# Patient Record
Sex: Female | Born: 1950 | Race: Black or African American | Hispanic: No | Marital: Married | Smoking: Never smoker
Health system: Southern US, Community
[De-identification: ages and names within clinical notes are randomized; demographics above are authoritative.]

## PROBLEM LIST (undated history)

## (undated) DIAGNOSIS — Z923 Personal history of irradiation: Secondary | ICD-10-CM

## (undated) DIAGNOSIS — F329 Major depressive disorder, single episode, unspecified: Secondary | ICD-10-CM

## (undated) DIAGNOSIS — M810 Age-related osteoporosis without current pathological fracture: Secondary | ICD-10-CM

## (undated) DIAGNOSIS — M25519 Pain in unspecified shoulder: Secondary | ICD-10-CM

## (undated) DIAGNOSIS — F411 Generalized anxiety disorder: Secondary | ICD-10-CM

## (undated) DIAGNOSIS — G4700 Insomnia, unspecified: Secondary | ICD-10-CM

## (undated) DIAGNOSIS — I10 Essential (primary) hypertension: Secondary | ICD-10-CM

## (undated) DIAGNOSIS — R002 Palpitations: Secondary | ICD-10-CM

## (undated) DIAGNOSIS — C310 Malignant neoplasm of maxillary sinus: Secondary | ICD-10-CM

## (undated) DIAGNOSIS — C801 Malignant (primary) neoplasm, unspecified: Secondary | ICD-10-CM

## (undated) HISTORY — DX: Malignant (primary) neoplasm, unspecified: C80.1

## (undated) HISTORY — DX: Insomnia, unspecified: G47.00

## (undated) HISTORY — DX: Generalized anxiety disorder: F41.1

## (undated) HISTORY — DX: Pain in unspecified shoulder: M25.519

## (undated) HISTORY — DX: Essential (primary) hypertension: I10

## (undated) HISTORY — DX: Age-related osteoporosis without current pathological fracture: M81.0

## (undated) HISTORY — DX: Palpitations: R00.2

## (undated) HISTORY — DX: Major depressive disorder, single episode, unspecified: F32.9

## (undated) HISTORY — DX: Malignant neoplasm of maxillary sinus: C31.0

## (undated) HISTORY — DX: Personal history of irradiation: Z92.3

---

## 1999-06-03 ENCOUNTER — Other Ambulatory Visit: Admission: RE | Admit: 1999-06-03 | Discharge: 1999-06-03 | Payer: Self-pay | Admitting: Obstetrics and Gynecology

## 2002-03-15 ENCOUNTER — Other Ambulatory Visit: Admission: RE | Admit: 2002-03-15 | Discharge: 2002-03-15 | Payer: Self-pay | Admitting: Obstetrics and Gynecology

## 2003-03-21 ENCOUNTER — Other Ambulatory Visit: Admission: RE | Admit: 2003-03-21 | Discharge: 2003-03-21 | Payer: Self-pay | Admitting: Obstetrics and Gynecology

## 2003-07-20 ENCOUNTER — Emergency Department (HOSPITAL_COMMUNITY): Admission: EM | Admit: 2003-07-20 | Discharge: 2003-07-20 | Payer: Self-pay | Admitting: Emergency Medicine

## 2003-12-22 ENCOUNTER — Emergency Department (HOSPITAL_COMMUNITY): Admission: EM | Admit: 2003-12-22 | Discharge: 2003-12-22 | Payer: Self-pay | Admitting: Emergency Medicine

## 2004-07-02 ENCOUNTER — Ambulatory Visit: Payer: Self-pay | Admitting: Psychiatry

## 2004-07-11 ENCOUNTER — Encounter: Payer: Self-pay | Admitting: Internal Medicine

## 2004-09-23 ENCOUNTER — Ambulatory Visit: Payer: Self-pay | Admitting: Internal Medicine

## 2004-10-21 ENCOUNTER — Other Ambulatory Visit: Admission: RE | Admit: 2004-10-21 | Discharge: 2004-10-21 | Payer: Self-pay | Admitting: Obstetrics and Gynecology

## 2005-03-16 ENCOUNTER — Ambulatory Visit: Payer: Self-pay | Admitting: Internal Medicine

## 2005-03-23 ENCOUNTER — Ambulatory Visit: Payer: Self-pay | Admitting: Internal Medicine

## 2005-12-01 ENCOUNTER — Ambulatory Visit: Payer: Self-pay | Admitting: Internal Medicine

## 2006-04-13 ENCOUNTER — Ambulatory Visit: Payer: Self-pay | Admitting: Internal Medicine

## 2006-04-14 LAB — CONVERTED CEMR LAB
ALT: 19 units/L (ref 0–40)
Albumin: 3.6 g/dL (ref 3.5–5.2)
Chloride: 106 meq/L (ref 96–112)
Glucose, Bld: 110 mg/dL — ABNORMAL HIGH (ref 70–99)
MCV: 72.4 fL — ABNORMAL LOW (ref 78.0–100.0)
Potassium: 4.7 meq/L (ref 3.5–5.1)
RBC: 5.43 M/uL — ABNORMAL HIGH (ref 3.87–5.11)
RDW: 14.2 % (ref 11.5–14.6)
Sodium: 140 meq/L (ref 135–145)
TSH: 1.29 microintl units/mL (ref 0.35–5.50)
Total Bilirubin: 0.4 mg/dL (ref 0.3–1.2)
WBC: 5.1 10*3/uL (ref 4.5–10.5)

## 2007-06-16 ENCOUNTER — Telehealth: Payer: Self-pay | Admitting: Internal Medicine

## 2007-06-16 ENCOUNTER — Ambulatory Visit: Payer: Self-pay | Admitting: Internal Medicine

## 2007-06-16 DIAGNOSIS — F4322 Adjustment disorder with anxiety: Secondary | ICD-10-CM | POA: Insufficient documentation

## 2007-06-16 DIAGNOSIS — F411 Generalized anxiety disorder: Secondary | ICD-10-CM

## 2007-06-16 DIAGNOSIS — G4700 Insomnia, unspecified: Secondary | ICD-10-CM

## 2007-06-16 HISTORY — DX: Insomnia, unspecified: G47.00

## 2007-06-16 HISTORY — DX: Generalized anxiety disorder: F41.1

## 2007-06-17 ENCOUNTER — Telehealth: Payer: Self-pay | Admitting: Internal Medicine

## 2007-11-03 ENCOUNTER — Ambulatory Visit: Payer: Self-pay | Admitting: Internal Medicine

## 2007-11-03 LAB — CONVERTED CEMR LAB
ALT: 19 U/L
AST: 17 U/L
Albumin: 3.7 g/dL
Alkaline Phosphatase: 58 U/L
BUN: 12 mg/dL
Basophils Absolute: 0 K/uL
Basophils Relative: 0.7 %
Bilirubin, Direct: 0.1 mg/dL
CO2: 31 meq/L
Calcium: 9.3 mg/dL
Chloride: 107 meq/L
Cholesterol: 243 mg/dL
Creatinine, Ser: 0.9 mg/dL
Direct LDL: 139.5 mg/dL
Eosinophils Absolute: 0.1 K/uL
Eosinophils Relative: 2.6 %
GFR calc Af Amer: 83 mL/min
GFR calc non Af Amer: 69 mL/min
Glucose, Bld: 75 mg/dL
Glucose, Urine, Semiquant: NEGATIVE
HCT: 39.1 %
HDL: 87.3 mg/dL
Hemoglobin: 12.8 g/dL
Ketones, urine, test strip: NEGATIVE
Lymphocytes Relative: 23.5 %
MCHC: 32.7 g/dL
MCV: 71.7 fL — ABNORMAL LOW
Monocytes Absolute: 0.4 K/uL
Monocytes Relative: 9.6 %
Neutro Abs: 2.5 K/uL
Neutrophils Relative %: 63.6 %
Nitrite: NEGATIVE
Platelets: 199 K/uL
Potassium: 4.3 meq/L
Protein, U semiquant: NEGATIVE
RBC: 5.45 M/uL — ABNORMAL HIGH
RDW: 14.1 %
Sodium: 143 meq/L
Specific Gravity, Urine: 1.02
TSH: 1.49 u[IU]/mL
Total Bilirubin: 0.7 mg/dL
Total CHOL/HDL Ratio: 2.8
Total Protein: 6.8 g/dL
Triglycerides: 44 mg/dL
VLDL: 9 mg/dL
WBC: 3.9 10*3/microliter — ABNORMAL LOW
pH: 6.5

## 2007-11-04 ENCOUNTER — Ambulatory Visit: Payer: Self-pay | Admitting: Internal Medicine

## 2007-11-04 DIAGNOSIS — F3289 Other specified depressive episodes: Secondary | ICD-10-CM

## 2007-11-04 DIAGNOSIS — F329 Major depressive disorder, single episode, unspecified: Secondary | ICD-10-CM

## 2007-11-04 DIAGNOSIS — M810 Age-related osteoporosis without current pathological fracture: Secondary | ICD-10-CM

## 2007-11-04 HISTORY — DX: Major depressive disorder, single episode, unspecified: F32.9

## 2007-11-04 HISTORY — DX: Age-related osteoporosis without current pathological fracture: M81.0

## 2007-11-04 HISTORY — DX: Other specified depressive episodes: F32.89

## 2008-05-29 ENCOUNTER — Ambulatory Visit: Payer: Self-pay | Admitting: Internal Medicine

## 2008-05-29 DIAGNOSIS — M25519 Pain in unspecified shoulder: Secondary | ICD-10-CM

## 2008-05-29 HISTORY — DX: Pain in unspecified shoulder: M25.519

## 2008-06-29 HISTORY — PX: SKIN GRAFT: SHX250

## 2008-07-10 ENCOUNTER — Encounter: Payer: Self-pay | Admitting: Internal Medicine

## 2008-07-11 ENCOUNTER — Ambulatory Visit: Payer: Self-pay | Admitting: Internal Medicine

## 2008-07-11 DIAGNOSIS — R002 Palpitations: Secondary | ICD-10-CM

## 2008-07-11 HISTORY — DX: Palpitations: R00.2

## 2008-10-23 ENCOUNTER — Ambulatory Visit: Payer: Self-pay | Admitting: Internal Medicine

## 2008-10-29 ENCOUNTER — Telehealth: Payer: Self-pay | Admitting: Internal Medicine

## 2008-12-11 ENCOUNTER — Ambulatory Visit: Payer: Self-pay | Admitting: Internal Medicine

## 2008-12-11 DIAGNOSIS — I10 Essential (primary) hypertension: Secondary | ICD-10-CM

## 2008-12-11 HISTORY — DX: Essential (primary) hypertension: I10

## 2009-01-17 ENCOUNTER — Telehealth: Payer: Self-pay | Admitting: Internal Medicine

## 2009-02-05 ENCOUNTER — Encounter: Admission: RE | Admit: 2009-02-05 | Discharge: 2009-02-05 | Payer: Self-pay | Admitting: Otolaryngology

## 2009-02-27 HISTORY — PX: OTHER SURGICAL HISTORY: SHX169

## 2009-03-24 ENCOUNTER — Encounter: Payer: Self-pay | Admitting: Internal Medicine

## 2009-04-04 ENCOUNTER — Encounter (INDEPENDENT_AMBULATORY_CARE_PROVIDER_SITE_OTHER): Payer: Self-pay | Admitting: *Deleted

## 2009-04-18 ENCOUNTER — Encounter (INDEPENDENT_AMBULATORY_CARE_PROVIDER_SITE_OTHER): Payer: Self-pay | Admitting: *Deleted

## 2009-04-18 ENCOUNTER — Ambulatory Visit: Admission: RE | Admit: 2009-04-18 | Discharge: 2009-06-28 | Payer: Self-pay | Admitting: Radiation Oncology

## 2009-04-19 ENCOUNTER — Ambulatory Visit: Payer: Self-pay | Admitting: Internal Medicine

## 2009-04-19 ENCOUNTER — Encounter (INDEPENDENT_AMBULATORY_CARE_PROVIDER_SITE_OTHER): Payer: Self-pay | Admitting: *Deleted

## 2009-04-19 DIAGNOSIS — C310 Malignant neoplasm of maxillary sinus: Secondary | ICD-10-CM | POA: Insufficient documentation

## 2009-04-19 HISTORY — DX: Malignant neoplasm of maxillary sinus: C31.0

## 2009-05-10 ENCOUNTER — Encounter (INDEPENDENT_AMBULATORY_CARE_PROVIDER_SITE_OTHER): Payer: Self-pay | Admitting: *Deleted

## 2009-05-10 LAB — BUN: BUN: 17 mg/dL (ref 6–23)

## 2009-05-10 LAB — CREATININE, SERUM: Creatinine, Ser: 0.7 mg/dL (ref 0.40–1.20)

## 2009-05-16 ENCOUNTER — Encounter (INDEPENDENT_AMBULATORY_CARE_PROVIDER_SITE_OTHER): Payer: Self-pay | Admitting: *Deleted

## 2009-06-30 ENCOUNTER — Ambulatory Visit: Admission: RE | Admit: 2009-06-30 | Discharge: 2009-07-19 | Payer: Self-pay | Admitting: Radiation Oncology

## 2009-07-19 ENCOUNTER — Encounter: Payer: Self-pay | Admitting: Internal Medicine

## 2009-07-25 ENCOUNTER — Ambulatory Visit: Payer: Self-pay | Admitting: Internal Medicine

## 2009-08-15 ENCOUNTER — Encounter: Payer: Self-pay | Admitting: Internal Medicine

## 2009-10-17 ENCOUNTER — Encounter: Payer: Self-pay | Admitting: Internal Medicine

## 2009-12-26 ENCOUNTER — Encounter: Payer: Self-pay | Admitting: Internal Medicine

## 2010-04-11 ENCOUNTER — Encounter: Admission: RE | Admit: 2010-04-11 | Discharge: 2010-04-11 | Payer: Self-pay | Admitting: Otolaryngology

## 2010-05-01 ENCOUNTER — Encounter: Payer: Self-pay | Admitting: Internal Medicine

## 2010-05-26 ENCOUNTER — Encounter: Payer: Self-pay | Admitting: Internal Medicine

## 2010-06-09 ENCOUNTER — Encounter: Payer: Self-pay | Admitting: Internal Medicine

## 2010-06-18 ENCOUNTER — Encounter
Admission: RE | Admit: 2010-06-18 | Discharge: 2010-06-18 | Payer: Self-pay | Source: Home / Self Care | Attending: Otolaryngology | Admitting: Otolaryngology

## 2010-06-29 LAB — HM MAMMOGRAPHY: HM Mammogram: NEGATIVE

## 2010-06-29 LAB — HM PAP SMEAR: HM Pap smear: NORMAL

## 2010-07-29 NOTE — Letter (Signed)
Summary: [facility]  [facility]   Imported By: Maryln Gottron 09/10/2009 14:01:26  _____________________________________________________________________  External Attachment:    Type:   Image     Comment:   External Document

## 2010-07-29 NOTE — Letter (Signed)
[facility]   Imported By: Maryln Gottron 01/23/2010 12:43:09  _____________________________________________________________________  External Attachment:    Type:   Image     Comment:   External Document

## 2010-07-29 NOTE — Letter (Signed)
[facility]   Imported By: Maryln Gottron 01/23/2010 12:17:00  _____________________________________________________________________  External Attachment:    Type:   Image     Comment:   External Document

## 2010-07-29 NOTE — Letter (Signed)
Summary: [facility]  [facility]   Imported By: Maryln Gottron 11/05/2009 12:32:31  _____________________________________________________________________  External Attachment:    Type:   Image     Comment:   External Document

## 2010-07-29 NOTE — Letter (Signed)
Summary:  [facility]  [facility]   Imported By: Maryln Gottron 05/20/2010 12:28:16  _____________________________________________________________________  External Attachment:    Type:   Image     Comment:   External Document

## 2010-07-29 NOTE — Assessment & Plan Note (Signed)
Summary: F/U ON HTN CONCERNS // RS   Vital Signs:  Patient profile:   60 year old female Weight:      112 pounds BP sitting:   102 / 76  (left arm) Cuff size:   regular  Vitals Entered By: Raechel Ache, RN (July 25, 2009 11:47 AM) CC: Check BP.   CC:  Check BP.Marland Kitchen  History of Present Illness: 60 year old patient who is seen today for follow-up of her hypertension.  No concerns or complaints.  She has been followed closely by ENT at [facility].  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  She does monitor her blood pressure readings at home with much results.  He denies any symptoms concerning for orthostatic hypotension.  Her weight has been stable  Past History:  Past Medical History: Reviewed history from 04/19/2009 and no changes required. Anxiety Depression Osteoporosis G3 P3, A0 remote history of paresthesias, and diplopia Hypertension- treated June 2010 history of right maxillary cancer-TINOMO  Review of Systems  The patient denies anorexia, fever, weight loss, weight gain, vision loss, decreased hearing, hoarseness, chest pain, syncope, dyspnea on exertion, peripheral edema, prolonged cough, headaches, hemoptysis, abdominal pain, melena, hematochezia, severe indigestion/heartburn, hematuria, incontinence, genital sores, muscle weakness, suspicious skin lesions, transient blindness, difficulty walking, depression, unusual weight change, abnormal bleeding, enlarged lymph nodes, angioedema, and breast masses.    Physical Exam  General:  Well-developed,well-nourished,in no acute distress; alert,appropriate and cooperative throughout examination; blood pressure 100/64 Head:  Normocephalic and atraumatic without obvious abnormalities. No apparent alopecia or balding. Eyes:  No corneal or conjunctival inflammation noted. EOMI. Perrla. Funduscopic exam benign, without hemorrhages, exudates or papilledema. Vision grossly  normal. Mouth:  Oral mucosa and oropharynx without lesions or exudates.  Teeth in good repair. hoarse;  large defect in the right buccal mucosa Neck:  No deformities, masses, or tenderness noted. Lungs:  Normal respiratory effort, chest expands symmetrically. Lungs are clear to auscultation, no crackles or wheezes. Heart:  Normal rate and regular rhythm. S1 and S2 normal without gallop, murmur, click, rub or other extra sounds. Abdomen:  Bowel sounds positive,abdomen soft and non-tender without masses, organomegaly or hernias noted.   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 One daily  Her updated medication list for this problem includes:    Hydrochlorothiazide 25 Mg Tabs (Hydrochlorothiazide) ..... One daily  Complete Medication List: 1)  Fosamax 70 Mg Tabs (Alendronate sodium) .Marland Kitchen.. 1 q week 2)  Lorazepam 0.5 Mg Tabs (Lorazepam) .... One twice daily as needed for anxiety 3)  Hydrochlorothiazide 25 Mg Tabs (Hydrochlorothiazide) .... One daily  Patient Instructions: 1)  Limit your Sodium (Salt). 2)  It is important that you exercise regularly at least 20 minutes 5 times a week. If you develop chest pain, have severe difficulty breathing, or feel very tired , stop exercising immediately and seek medical attention. 3)  Check your Blood Pressure regularly. If it is above 150/90  you should make an appointment. Prescriptions: HYDROCHLOROTHIAZIDE 25 MG TABS (HYDROCHLOROTHIAZIDE) one daily  #90 x 6   Entered and Authorized by:   Gordy Savers  MD   Signed by:   Gordy Savers  MD on 07/25/2009   Method used:   Print then Give to Patient   RxID:   [id number]  LORAZEPAM 0.5 MG TABS (LORAZEPAM) one twice daily as needed for anxiety  #90 x 4   Entered and Authorized  by:   Gordy Savers  MD   Signed by:   Gordy Savers  MD on 07/25/2009   Method used:   Print then Give to Patient   RxID:   [id number] FOSAMAX 70 MG  TABS (ALENDRONATE SODIUM) 1 q week  #12 x 6   Entered and Authorized by:   Gordy Savers  MD   Signed by:   Gordy Savers  MD on 07/25/2009   Method used:   Print then Give to Patient   RxID:   [id number]

## 2010-07-31 NOTE — Letter (Signed)
Summary: [facility]  [facility]   Imported By: Maryln Gottron 06/11/2010 13:21:14  _____________________________________________________________________  External Attachment:    Type:   Image     Comment:   External Document

## 2010-07-31 NOTE — Letter (Signed)
Summary: [facility]  [facility]   Imported By: Maryln Gottron 07/01/2010 11:02:06  _____________________________________________________________________  External Attachment:    Type:   Image     Comment:   External Document

## 2010-08-29 ENCOUNTER — Other Ambulatory Visit: Payer: Self-pay | Admitting: Otolaryngology

## 2010-08-29 DIAGNOSIS — E041 Nontoxic single thyroid nodule: Secondary | ICD-10-CM

## 2010-09-02 ENCOUNTER — Other Ambulatory Visit: Payer: Self-pay

## 2010-10-08 ENCOUNTER — Telehealth: Payer: Self-pay | Admitting: Emergency Medicine

## 2010-10-08 ENCOUNTER — Inpatient Hospital Stay: Admission: RE | Admit: 2010-10-08 | Payer: Self-pay | Source: Ambulatory Visit

## 2010-10-16 ENCOUNTER — Other Ambulatory Visit (INDEPENDENT_AMBULATORY_CARE_PROVIDER_SITE_OTHER): Payer: Managed Care, Other (non HMO)

## 2010-10-16 DIAGNOSIS — E785 Hyperlipidemia, unspecified: Secondary | ICD-10-CM

## 2010-10-16 DIAGNOSIS — Z0000 Encounter for general adult medical examination without abnormal findings: Secondary | ICD-10-CM

## 2010-10-16 LAB — CBC WITH DIFFERENTIAL/PLATELET
Basophils Relative: 0.5 % (ref 0.0–3.0)
Eosinophils Relative: 3 % (ref 0.0–5.0)
HCT: 40.7 % (ref 36.0–46.0)
Lymphs Abs: 0.7 10*3/uL (ref 0.7–4.0)
MCV: 73 fl — ABNORMAL LOW (ref 78.0–100.0)
Monocytes Absolute: 0.5 10*3/uL (ref 0.1–1.0)
Platelets: 221 10*3/uL (ref 150.0–400.0)
WBC: 5.8 10*3/uL (ref 4.5–10.5)

## 2010-10-16 LAB — POCT URINALYSIS DIPSTICK
Bilirubin, UA: NEGATIVE
Ketones, UA: NEGATIVE
Spec Grav, UA: 1.02
pH, UA: 5

## 2010-10-16 LAB — HEPATIC FUNCTION PANEL
ALT: 18 U/L (ref 0–35)
Albumin: 3.7 g/dL (ref 3.5–5.2)
Total Bilirubin: 0.4 mg/dL (ref 0.3–1.2)
Total Protein: 6.8 g/dL (ref 6.0–8.3)

## 2010-10-16 LAB — LIPID PANEL
Cholesterol: 219 mg/dL — ABNORMAL HIGH (ref 0–200)
Triglycerides: 31 mg/dL (ref 0.0–149.0)

## 2010-10-16 LAB — BASIC METABOLIC PANEL
BUN: 16 mg/dL (ref 6–23)
Chloride: 105 mEq/L (ref 96–112)
Potassium: 4.5 mEq/L (ref 3.5–5.1)

## 2010-10-22 ENCOUNTER — Encounter: Payer: Self-pay | Admitting: Internal Medicine

## 2010-10-23 ENCOUNTER — Encounter: Payer: Self-pay | Admitting: Internal Medicine

## 2010-10-23 ENCOUNTER — Ambulatory Visit (INDEPENDENT_AMBULATORY_CARE_PROVIDER_SITE_OTHER): Payer: Managed Care, Other (non HMO) | Admitting: Internal Medicine

## 2010-10-23 VITALS — BP 116/78 | HR 76 | Temp 98.4°F | Resp 18 | Ht 63.5 in | Wt 119.0 lb

## 2010-10-23 DIAGNOSIS — C310 Malignant neoplasm of maxillary sinus: Secondary | ICD-10-CM

## 2010-10-23 DIAGNOSIS — I10 Essential (primary) hypertension: Secondary | ICD-10-CM

## 2010-10-23 DIAGNOSIS — Z0000 Encounter for general adult medical examination without abnormal findings: Secondary | ICD-10-CM

## 2010-10-23 MED ORDER — LORAZEPAM 0.5 MG PO TABS: 0.5000 mg | ORAL_TABLET | Freq: Two times a day (BID) | ORAL | Status: DC | PRN

## 2010-10-23 MED ORDER — HYDROCHLOROTHIAZIDE 25 MG PO TABS: 25.0000 mg | ORAL_TABLET | Freq: Every day | ORAL | Status: DC

## 2010-10-23 NOTE — Patient Instructions (Signed)
Dermatology evaluation as discussed ENT and gynecology followup  Take a calcium supplement, plus [phone] units of vitamin D    It is important that you exercise regularly, at least 20 minutes 3 to 4 times per week.  If you develop chest pain or shortness of breath seek  medical attention.  Please check your blood pressure on a regular basis.  If it is consistently greater than 150/90, please make an office appointment.  Limit your sodium (Salt) intake  Return in one year for follow-up

## 2010-10-23 NOTE — Progress Notes (Signed)
  Subjective:    Patient ID: Brandy Ramos, female    DOB: Jun 12, 1951, 60 y.o.   MRN: [id number]  HPI  60 year old patient who is seen today for a wellness exam. She has a history of treated hypertension well controlled on diuretic therapy in 2003 and she underwent surgery and radiotherapy for a malignant neoplasm of the right maxillary sinus. She has a history of mild anxiety depression which has been stable. She has treated osteoporosis.    Review of Systems  Constitutional: Negative for fever, appetite change, fatigue and unexpected weight change.  HENT: Negative for hearing loss, ear pain, nosebleeds, congestion, sore throat, mouth sores, trouble swallowing, neck stiffness, dental problem, voice change, sinus pressure and tinnitus.   Eyes: Negative for photophobia, pain, redness and visual disturbance.  Respiratory: Negative for cough, chest tightness and shortness of breath.   Cardiovascular: Negative for chest pain, palpitations and leg swelling.  Gastrointestinal: Negative for nausea, vomiting, abdominal pain, diarrhea, constipation, blood in stool, abdominal distention and rectal pain.  Genitourinary: Negative for dysuria, urgency, frequency, hematuria, flank pain, vaginal bleeding, vaginal discharge, difficulty urinating, genital sores, vaginal pain, menstrual problem and pelvic pain.  Musculoskeletal: Negative for back pain and arthralgias.  Skin: Negative for rash.  Neurological: Negative for dizziness, syncope, speech difficulty, weakness, light-headedness, numbness and headaches.  Hematological: Negative for adenopathy. Does not bruise/bleed easily.  Psychiatric/Behavioral: Negative for suicidal ideas, behavioral problems, self-injury, dysphoric mood and agitation. The patient is not nervous/anxious.        Objective:   Physical Exam  Constitutional: She is oriented to person, place, and time. She appears well-developed and well-nourished.  HENT:  Head: Normocephalic and  atraumatic.  Right Ear: External ear normal.  Left Ear: External ear normal.  Mouth/Throat: Oropharynx is clear and moist.       A large oval defect noted involving me right lateral hard palate  Eyes: Conjunctivae and EOM are normal.  Neck: Normal range of motion. Neck supple. No JVD present. No thyromegaly present.  Cardiovascular: Normal rate, regular rhythm, normal heart sounds and intact distal pulses.   No murmur heard. Pulmonary/Chest: Effort normal and breath sounds normal. She has no wheezes. She has no rales.  Abdominal: Soft. Bowel sounds are normal. She exhibits no distension and no mass. There is no tenderness. There is no rebound and no guarding.  Musculoskeletal: Normal range of motion. She exhibits no edema and no tenderness.  Neurological: She is alert and oriented to person, place, and time. She has normal reflexes. No cranial nerve deficit. She exhibits normal muscle tone. Coordination normal.  Skin: Skin is warm and dry. No rash noted.       Patient had a 5 mm pigmented macule involving the sole of the right foot  Psychiatric: She has a normal mood and affect. Her behavior is normal.          Assessment & Plan:   Annual clinical examination Hypertension stable History of maxillary sinus neoplasm Osteoporosis  We'll continue ENT and gynecologic followup.  Have also recommended the patient see a dermatologist to evaluate a pigmented lesion of her right sole of the foot. Recheck in one year

## 2010-10-30 ENCOUNTER — Ambulatory Visit
Admission: RE | Admit: 2010-10-30 | Payer: Managed Care, Other (non HMO) | Source: Ambulatory Visit | Admitting: Radiation Oncology

## 2011-01-22 NOTE — Telephone Encounter (Signed)
TELEPHONE NOTE 

## 2011-10-19 ENCOUNTER — Other Ambulatory Visit (INDEPENDENT_AMBULATORY_CARE_PROVIDER_SITE_OTHER): Payer: Managed Care, Other (non HMO)

## 2011-10-19 DIAGNOSIS — Z0000 Encounter for general adult medical examination without abnormal findings: Secondary | ICD-10-CM

## 2011-10-19 LAB — BASIC METABOLIC PANEL
Chloride: 105 mEq/L (ref 96–112)
GFR: 67.58 mL/min (ref >60.00)
Potassium: 3.9 mEq/L (ref 3.5–5.1)

## 2011-10-19 LAB — CBC WITH DIFFERENTIAL/PLATELET
Basophils Relative: 0.3 % (ref 0.0–3.0)
Eosinophils Relative: 2.8 % (ref 0.0–5.0)
HCT: 40 % (ref 36.0–46.0)
MCV: 71.8 fl — ABNORMAL LOW (ref 78.0–100.0)
Monocytes Absolute: 0.4 10*3/uL (ref 0.1–1.0)
Monocytes Relative: 8.7 % (ref 3.0–12.0)
Neutrophils Relative %: 69.3 % (ref 43.0–77.0)
RBC: 5.58 Mil/uL — ABNORMAL HIGH (ref 3.87–5.11)
WBC: 4.2 10*3/uL — ABNORMAL LOW (ref 4.5–10.5)

## 2011-10-19 LAB — HEPATIC FUNCTION PANEL
ALT: 16 U/L (ref 0–35)
AST: 22 U/L (ref 0–37)
Bilirubin, Direct: 0 mg/dL (ref 0.0–0.3)
Total Protein: 7.3 g/dL (ref 6.0–8.3)

## 2011-10-19 LAB — POCT URINALYSIS DIPSTICK
Bilirubin, UA: NEGATIVE
Ketones, UA: NEGATIVE
Leukocytes, UA: NEGATIVE
Protein, UA: NEGATIVE
Spec Grav, UA: 1.02
pH, UA: 6.5

## 2011-10-19 LAB — LIPID PANEL
HDL: 90.9 mg/dL (ref >39.00)
Total CHOL/HDL Ratio: 3
VLDL: 7.8 mg/dL (ref 0.0–40.0)

## 2011-10-20 LAB — LDL CHOLESTEROL, DIRECT: Direct LDL: 132.2 mg/dL

## 2011-10-26 ENCOUNTER — Encounter: Payer: Self-pay | Admitting: Internal Medicine

## 2011-10-26 ENCOUNTER — Ambulatory Visit (INDEPENDENT_AMBULATORY_CARE_PROVIDER_SITE_OTHER): Payer: Managed Care, Other (non HMO) | Admitting: Internal Medicine

## 2011-10-26 VITALS — BP 140/90 | HR 72 | Temp 98.2°F | Resp 16 | Ht 63.5 in | Wt 126.0 lb

## 2011-10-26 DIAGNOSIS — C310 Malignant neoplasm of maxillary sinus: Secondary | ICD-10-CM

## 2011-10-26 DIAGNOSIS — I10 Essential (primary) hypertension: Secondary | ICD-10-CM

## 2011-10-26 DIAGNOSIS — M810 Age-related osteoporosis without current pathological fracture: Secondary | ICD-10-CM

## 2011-10-26 DIAGNOSIS — Z0000 Encounter for general adult medical examination without abnormal findings: Secondary | ICD-10-CM

## 2011-10-26 MED ORDER — HYDROCHLOROTHIAZIDE 25 MG PO TABS: 25.0000 mg | ORAL_TABLET | Freq: Every day | ORAL | Status: DC

## 2011-10-26 MED ORDER — LORAZEPAM 0.5 MG PO TABS: 0.5000 mg | ORAL_TABLET | Freq: Two times a day (BID) | ORAL | Status: DC | PRN

## 2011-10-26 NOTE — Progress Notes (Signed)
Subjective:    Patient ID: Brandy Ramos, female    DOB: February 26, 1951, 61 y.o.   MRN: [id number]  HPI  61 year old patient who is seen today for followup. She is seen for a preventive health examination. She is followed by Dr. Tenny Craw and length and has had a recent gynecologic evaluation. She is scheduled for followup mammogram later this year her last colonoscopy was 2003 were probably 2004. No new concerns or complaints. She is followed at [facility] following surgery for a right maxillary sinus cancer  Past Medical History  Diagnosis Date  . Anxiety state, unspecified 06/16/2007  . DEPRESSION 11/04/2007  . HYPERTENSION 12/11/2008  . INSOMNIA 06/16/2007  . 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 OSTEOPOROSIS 11/04/2007  . Palpitations 07/11/2008  . SHOULDER PAIN, RIGHT 05/29/2008    History   Social History  . Marital Status: Married    Spouse Name: N/A    Number of Children: N/A  . Years of Education: N/A   Occupational History  . Not on file.   Social History Main Topics  . Smoking status: Never Smoker   . Smokeless tobacco: Never Used  . Alcohol Use: Yes  . Drug Use: No  . Sexually Active: Not on file   Other Topics Concern  . Not on file   Social History Narrative  . No narrative on file    Past Surgical History  Procedure Date  . Cesarean section   . Skin graft 2010    right maxillectomy    Family History  Problem Relation Age of Onset  . Heart disease Mother   . Hypertension Mother     No Known Allergies  Current Outpatient Prescriptions on File Prior to Visit  Medication Sig Dispense Refill  . hydrochlorothiazide 25 MG tablet Take 1 tablet (25 mg total) by mouth daily.  90 tablet  6  . LORazepam (ATIVAN) 0.5 MG tablet Take 1 tablet (0.5 mg total) by mouth 2 (two) times daily as needed. anxiety  60 tablet  3    BP 140/90  Pulse 72  Temp(Src) 98.2 F (36.8 C) (Oral)  Resp 16  Ht 5' 3.5" (1.613 m)  Wt 126 lb (57.153 kg)  BMI  21.97 kg/m2  SpO2 96%       Review of Systems  Constitutional: Negative for fever, appetite change, fatigue and unexpected weight change.  HENT: Negative for hearing loss, ear pain, nosebleeds, congestion, sore throat, mouth sores, trouble swallowing, neck stiffness, dental problem, voice change, sinus pressure and tinnitus.   Eyes: Negative for photophobia, pain, redness and visual disturbance.  Respiratory: Negative for cough, chest tightness and shortness of breath.   Cardiovascular: Negative for chest pain, palpitations and leg swelling.  Gastrointestinal: Negative for nausea, vomiting, abdominal pain, diarrhea, constipation, blood in stool, abdominal distention and rectal pain.  Genitourinary: Negative for dysuria, urgency, frequency, hematuria, flank pain, vaginal bleeding, vaginal discharge, difficulty urinating, genital sores, vaginal pain, menstrual problem and pelvic pain.  Musculoskeletal: Negative for back pain and arthralgias.  Skin: Negative for rash.  Neurological: Negative for dizziness, syncope, speech difficulty, weakness, light-headedness, numbness and headaches.  Hematological: Negative for adenopathy. Does not bruise/bleed easily.  Psychiatric/Behavioral: Negative for suicidal ideas, behavioral problems, self-injury, dysphoric mood and agitation. The patient is not nervous/anxious.        Objective:   Physical Exam  Constitutional: She is oriented to person, place, and time. She appears well-developed and well-nourished.  HENT:  Head: Normocephalic and atraumatic.  Right Ear:  External ear normal.  Left Ear: External ear normal.  Mouth/Throat: Oropharynx is clear and moist.       Large oval defect in the right hard palate area  Eyes: Conjunctivae and EOM are normal.  Neck: Normal range of motion. Neck supple. No JVD present. No thyromegaly present.  Cardiovascular: Normal rate, regular rhythm, normal heart sounds and intact distal pulses.   No murmur  heard. Pulmonary/Chest: Effort normal and breath sounds normal. She has no wheezes. She has no rales.  Abdominal: Soft. Bowel sounds are normal. She exhibits no distension and no mass. There is no tenderness. There is no rebound and no guarding.  Musculoskeletal: Normal range of motion. She exhibits no edema and no tenderness.  Neurological: She is alert and oriented to person, place, and time. She has normal reflexes. No cranial nerve deficit. She exhibits normal muscle tone. Coordination (hard palate area) normal.  Skin: Skin is warm and dry. No rash noted.  Psychiatric: She has a normal mood and affect. Her behavior is normal.          Assessment & Plan:   Preventive health examination Hypertension controlled History right maxillary sinus cancer history of anxiety depression stable  We'll continue present regimen calcium and vitamin D supplements recommended. We'll follow up with gynecology and ENT at [facility]. Return here in one year.

## 2011-10-26 NOTE — Patient Instructions (Signed)
Limit your sodium (Salt) intake    It is important that you exercise regularly, at least 20 minutes 3 to 4 times per week.  If you develop chest pain or shortness of breath seek  medical attention.  Please check your blood pressure on a regular basis.  If it is consistently greater than 150/90, please make an office appointment.  Take a calcium supplement, plus 800-1200 units of vitamin D  Return in one year for follow-up  

## 2011-12-15 ENCOUNTER — Telehealth: Payer: Self-pay

## 2011-12-15 NOTE — Telephone Encounter (Signed)
Received message from pt requesting to come in to office for f/u check up.  Do not see where this pt has seen Dr. Truett Perna.  Message left for pt to call office back.

## 2011-12-16 ENCOUNTER — Telehealth: Payer: Self-pay

## 2011-12-16 NOTE — Telephone Encounter (Signed)
Received call from pt stating that she sees Dr. Corey Skains at [facility] in [place] for surveillance of her previous cancer.  Pt states he is her medical oncologist, and she did receive radiation here previously, but she would like to establish a medical oncologist closer.  Informed her will give this information to new pt coordinator, and office will get back in contact with her.  Pt states she would like to see Dr. Truett Perna or Dr. Gaylyn Rong.

## 2012-01-21 ENCOUNTER — Encounter: Payer: Self-pay | Admitting: Radiation Oncology

## 2012-01-21 ENCOUNTER — Ambulatory Visit
Admission: RE | Admit: 2012-01-21 | Discharge: 2012-01-21 | Disposition: A | Discharge status: Home / Self Care | Payer: Managed Care, Other (non HMO) | Source: Ambulatory Visit | Attending: Radiation Oncology | Admitting: Radiation Oncology

## 2012-01-21 VITALS — BP 162/94 | HR 83 | Temp 98.0°F | Resp 20 | Wt 122.3 lb

## 2012-01-21 DIAGNOSIS — C310 Malignant neoplasm of maxillary sinus: Secondary | ICD-10-CM

## 2012-01-21 NOTE — Progress Notes (Signed)
   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  She is feeling well and doing well. She still has occasional trismus symptoms if she talks too much or tri-state a piece of [place] meet. She saw Dr. Gordy Levan and who recommended ultrasound-guided biopsy of the thyroid nodule. She missed that appointment and would like to reschedule closer to home. She's also interested in moving her ENT closer to home. She is still trying to control her blood pressure. Her main complaint today is of her right submandibular gland. She states in the morning is normal size and in throughout the day starts to accumulate fluid. She says that swells and is swollen this morning and asked me to examine it. It is not painful. This does not happen on the left. She is still mostly does not speak with her prosthesis. She only wears it occasionally. Now that her mouth she feels like an open little bit more she is interested in being refitted for another one.  Allergies: No Known Allergies  Medications:  Current Outpatient Prescriptions  Medication Sig Dispense Refill  . hydrochlorothiazide (HYDRODIURIL) 25 MG tablet Take 1 tablet (25 mg total) by mouth daily.  90 tablet  6  . LORazepam (ATIVAN) 0.5 MG tablet Take 1 tablet (0.5 mg total) by mouth 2 (two) times daily as needed. anxiety  60 tablet  3    Physical Exam:   weight is 122 lb 4.8 oz (55.475 kg). Her oral temperature is 98 F (36.7 C). Her blood pressure is 162/94 and her pulse is 83. Her respiration is 20.  She has a palpable right submandibular gland. Nothing palpable in the left neck or submandibular region. No palpable supraclavicular adenopathy.  Examination of her operative bed shows no evidence of disease recurrence.  IMPRESSION: Brandy Ramos is a 61 y.o. female with no evidence of disease but an enlarging right submandibular gland  PLAN:  Not sure what going on with the submandibular gland I think it is worth investigating. It may just be from the radiation the C. to the segment of the gland is becoming scarred down. I've ordered an ultrasound of this area as well as an ultrasound to evaluate the thyroid nodule seen on recent CT scan. I've referred her to Dr. Jenne Pane at [facility] for surveillance. Because of the low-grade nature of her malignancy I think we'll follow her yearly for about 10 years. I've asked her to contact me with any concerning symptoms in the interim otherwise I will see her back in the year. We'll call her with the results of her imaging.    Lurline Hare, MD

## 2012-01-21 NOTE — Progress Notes (Signed)
Pt denies loss of appetite, pain, fatigue; does report eating issues associated w/chewing which she describes as "muscle aches" in her jaws.  Pt has not seen Dr Hezzie Bump, [facility] since 08/2010. She states she is trying to move all her drs to [facility], will see him one more time but has no appt scheduled at this time.

## 2012-01-22 ENCOUNTER — Telehealth: Payer: Self-pay | Admitting: *Deleted

## 2012-01-22 NOTE — Telephone Encounter (Signed)
CALLED PATIENT TO INFORM OF TEST AND OFFICE VISIT WITH DR. Karren Burly BATES ON 01-25-12- AT 9:20 AM - ARRIVAL TIME - 9;00 AM, SPOKE WITH PATIENT AND SHE IS AWARE OF THESE OF APPTS.

## 2012-01-25 ENCOUNTER — Ambulatory Visit (HOSPITAL_COMMUNITY)
Admission: RE | Admit: 2012-01-25 | Discharge: 2012-01-25 | Disposition: A | Discharge status: Home / Self Care | Payer: Managed Care, Other (non HMO) | Source: Ambulatory Visit | Attending: Radiation Oncology | Admitting: Radiation Oncology

## 2012-01-25 DIAGNOSIS — R220 Localized swelling, mass and lump, head: Secondary | ICD-10-CM | POA: Insufficient documentation

## 2012-01-25 DIAGNOSIS — C310 Malignant neoplasm of maxillary sinus: Secondary | ICD-10-CM

## 2012-01-25 DIAGNOSIS — E042 Nontoxic multinodular goiter: Secondary | ICD-10-CM | POA: Insufficient documentation

## 2012-02-23 ENCOUNTER — Other Ambulatory Visit: Payer: Self-pay | Admitting: Radiation Oncology

## 2012-02-23 ENCOUNTER — Encounter: Payer: Self-pay | Admitting: Radiation Oncology

## 2012-02-23 DIAGNOSIS — C310 Malignant neoplasm of maxillary sinus: Secondary | ICD-10-CM

## 2012-02-23 NOTE — Progress Notes (Signed)
Spoke with Dr. Jenne Pane today. Have agreed to CT to investigate submandibular LN as this was not in the radiated field. He also felt nothing further needed to be done about the thyroid nodules. She will be scheduled for CT and follow up with me pending those results.

## 2012-02-26 ENCOUNTER — Ambulatory Visit
Admission: RE | Admit: 2012-02-26 | Discharge: 2012-02-26 | Disposition: A | Discharge status: Home / Self Care | Payer: Managed Care, Other (non HMO) | Source: Ambulatory Visit | Attending: Radiation Oncology | Admitting: Radiation Oncology

## 2012-02-26 ENCOUNTER — Telehealth: Payer: Self-pay | Admitting: Radiation Oncology

## 2012-02-26 DIAGNOSIS — C310 Malignant neoplasm of maxillary sinus: Secondary | ICD-10-CM

## 2012-02-26 NOTE — Telephone Encounter (Signed)
Phoned patient's mobile number. Informed her of 03/10/12 CT scan at [address] [name]. Instructed patient to be NPO four hours prior to scan. Also, encouraged patient to present for labs 02/26/2012. Then, instructed patient to present for follow up with Dr. [name] 03/17/12 at 1010 to review CT results. All questions answered. Patient verbalized understanding of all things reviewed.

## 2012-03-10 ENCOUNTER — Ambulatory Visit (HOSPITAL_COMMUNITY)
Admission: RE | Admit: 2012-03-10 | Discharge: 2012-03-10 | Disposition: A | Discharge status: Home / Self Care | Payer: Managed Care, Other (non HMO) | Source: Ambulatory Visit | Attending: Radiation Oncology | Admitting: Radiation Oncology

## 2012-03-10 ENCOUNTER — Other Ambulatory Visit (HOSPITAL_COMMUNITY): Payer: Managed Care, Other (non HMO)

## 2012-03-10 ENCOUNTER — Other Ambulatory Visit: Payer: Self-pay | Admitting: Radiation Oncology

## 2012-03-10 DIAGNOSIS — R911 Solitary pulmonary nodule: Secondary | ICD-10-CM | POA: Insufficient documentation

## 2012-03-10 DIAGNOSIS — R599 Enlarged lymph nodes, unspecified: Secondary | ICD-10-CM | POA: Insufficient documentation

## 2012-03-10 DIAGNOSIS — C310 Malignant neoplasm of maxillary sinus: Secondary | ICD-10-CM

## 2012-03-10 DIAGNOSIS — E041 Nontoxic single thyroid nodule: Secondary | ICD-10-CM | POA: Insufficient documentation

## 2012-03-10 MED ORDER — IOHEXOL 300 MG/ML  SOLN
100.0000 mL | Freq: Once | INTRAMUSCULAR | Status: AC | PRN
  Administered 2012-03-10: 100 mL via INTRAVENOUS

## 2012-03-11 ENCOUNTER — Other Ambulatory Visit: Payer: Self-pay | Admitting: Radiation Oncology

## 2012-03-11 ENCOUNTER — Telehealth: Payer: Self-pay | Admitting: *Deleted

## 2012-03-11 DIAGNOSIS — C310 Malignant neoplasm of maxillary sinus: Secondary | ICD-10-CM

## 2012-03-11 NOTE — Telephone Encounter (Signed)
CALLED PATIENT TO INFORM OF TEST, LVM FOR A RETURN CALL 

## 2012-03-11 NOTE — Progress Notes (Signed)
Opened in error

## 2012-03-15 ENCOUNTER — Other Ambulatory Visit (HOSPITAL_COMMUNITY): Payer: Managed Care, Other (non HMO)

## 2012-03-17 ENCOUNTER — Encounter: Payer: Self-pay | Admitting: Radiation Oncology

## 2012-03-17 ENCOUNTER — Ambulatory Visit
Admission: RE | Admit: 2012-03-17 | Discharge: 2012-03-17 | Disposition: A | Discharge status: Home / Self Care | Payer: Managed Care, Other (non HMO) | Source: Ambulatory Visit | Attending: Radiation Oncology | Admitting: Radiation Oncology

## 2012-03-17 ENCOUNTER — Telehealth: Payer: Self-pay | Admitting: Radiation Oncology

## 2012-03-17 VITALS — BP 169/94 | HR 71 | Temp 97.1°F | Resp 18 | Wt 125.9 lb

## 2012-03-17 DIAGNOSIS — C310 Malignant neoplasm of maxillary sinus: Secondary | ICD-10-CM

## 2012-03-17 NOTE — Progress Notes (Signed)
   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  She had a CT of the head and neck which showed a 2.6 x 1.8 x 1.4 cm enhancing right submandibular nodule. This was close to the mandible without erosion. It seemed to be arising from the right submandibular gland. A right level II lymph node measured 15 x 9 mm. No other lymph nodes were noted. The left thyroid nodule was stable. A 5 mm area of enhancement in the left pons was noted. This was felt to be a small benign venous lesion. Metastases could also be considered. She reports stable symptoms. She does note that this right submandibular nodule will become slightly irritated and painful if she rubs it.   Allergies: No Known Allergies  Medications:  Current Outpatient Prescriptions  Medication Sig Dispense Refill  . hydrochlorothiazide (HYDRODIURIL) 25 MG tablet Take 1 tablet (25 mg total) by mouth daily.  90 tablet  6  . amoxicillin-clavulanate (AUGMENTIN) 875-125 MG per tablet       . ciclopirox (LOPROX) 0.77 % SUSP       . CORDRAN 0.05 % lotion       . LORazepam (ATIVAN) 0.5 MG tablet Take 1 tablet (0.5 mg total) by mouth 2 (two) times daily as needed. anxiety  60 tablet  3    Physical Exam:   weight is 125 lb 14.4 oz (57.108 kg). Her oral temperature is 97.1 F (36.2 C). Her blood pressure is 169/94 and her pulse is 71. Her respiration is 18.  I am not able to palpate any cervical adenopathy. She has a mobile and visible right submandibular nodule. This is not painful to palpation.  IMPRESSION: Terree is a 61 y.o. female with a new right submandibular nodule and new right cervical adenopathy  PLAN:  I discussed this  case with Dr. Hezzie Bump. We agreed to proceed forward with biopsy. The subareolar gland is out of the radiation field as I don't have a good explanation of her swelling. She asked me to call her with the results of the biopsy and she would like a biopsy scheduled is early in the morning hours late in the afternoon as possible. I will talk to Dr. Hezzie Bump after the biopsy and call her with the treatment plan.    Lurline Hare, MD

## 2012-03-17 NOTE — Progress Notes (Signed)
Blood pressure slightly elevated. Patient reports that she has no taken her blood pressure medication yet today. Patient took presumed bp pill while this writer was present in the room.

## 2012-03-17 NOTE — Telephone Encounter (Signed)
Called patient to inform her that the interventional radiology scheduler would be contacting her reference biopsy appointment and directions. Also, wanted to inform patient that biopsy slots are very limited. Left message requesting return call.

## 2012-03-17 NOTE — Progress Notes (Signed)
Received patient in the clinic today for a follow up appointment with Dr. [name]. Patient is alert and oriented to person, place, and time. No distress noted. Steady gait noted. Pleasant affect noted. Patient denies pain at this time. Patient denies cough and shortness of breath. Patient denies difficulty or pain with swallowing. Patient denies nausea, vomiting, headache, or dizziness. Patient denies diarrhea or constipation. Patient reports stable weight. Patient reports a good appetite. Patient reports she sleeps "like a rock." patient reports occasional soreness in her jaw when she has to chew for extended periods of time. Patient reports that if she has to talk a lot her words run together. Patient reports she has to concentrate to speak slower so others can understand her. Reported all findings to Dr. [name].

## 2012-03-21 ENCOUNTER — Telehealth: Payer: Self-pay | Admitting: *Deleted

## 2012-03-21 NOTE — Telephone Encounter (Signed)
CALLED PATIENT TO ASK QUESTION, LVM FOR A RETURN CALL 

## 2012-03-30 ENCOUNTER — Other Ambulatory Visit: Payer: Self-pay | Admitting: Radiology

## 2012-04-01 ENCOUNTER — Other Ambulatory Visit: Payer: Self-pay | Admitting: Physician Assistant

## 2012-04-06 ENCOUNTER — Encounter (HOSPITAL_COMMUNITY): Payer: Self-pay | Admitting: Pharmacy Technician

## 2012-04-07 ENCOUNTER — Telehealth: Payer: Self-pay | Admitting: Internal Medicine

## 2012-04-07 DIAGNOSIS — Z85819 Personal history of malignant neoplasm of unspecified site of lip, oral cavity, and pharynx: Secondary | ICD-10-CM | POA: Insufficient documentation

## 2012-04-07 NOTE — Telephone Encounter (Signed)
Pt just faxed over a wellness form today approx 3:00pm that needs to be completed asap. This is for patients insurance enrollment. Pls confirm that fax was rcvd when completed. Lv detailed vm if pt not avail.

## 2012-04-07 NOTE — Telephone Encounter (Signed)
I have not seen form yet.

## 2012-04-08 ENCOUNTER — Ambulatory Visit (HOSPITAL_COMMUNITY)
Admission: RE | Admit: 2012-04-08 | Discharge: 2012-04-08 | Disposition: A | Discharge status: Home / Self Care | Payer: Managed Care, Other (non HMO) | Source: Ambulatory Visit | Attending: Radiation Oncology | Admitting: Radiation Oncology

## 2012-04-08 DIAGNOSIS — C310 Malignant neoplasm of maxillary sinus: Secondary | ICD-10-CM | POA: Insufficient documentation

## 2012-04-08 LAB — CBC
HCT: 39.5 % (ref 36.0–46.0)
Hemoglobin: 13.6 g/dL (ref 12.0–15.0)
MCV: 68.6 fL — ABNORMAL LOW (ref 78.0–100.0)
Platelets: 229 10*3/uL (ref 150–400)
RBC: 5.76 MIL/uL — ABNORMAL HIGH (ref 3.87–5.11)
WBC: 6.3 10*3/uL (ref 4.0–10.5)

## 2012-04-08 MED ORDER — MIDAZOLAM HCL 2 MG/2ML IJ SOLN
INTRAMUSCULAR | Status: AC
  Filled 2012-04-08: qty 4

## 2012-04-08 MED ORDER — SODIUM CHLORIDE 0.9 % IV SOLN: Freq: Once | INTRAVENOUS | Status: DC

## 2012-04-08 MED ORDER — FENTANYL CITRATE 0.05 MG/ML IJ SOLN
INTRAMUSCULAR | Status: AC | PRN
  Administered 2012-04-08: 50 ug via INTRAVENOUS
  Administered 2012-04-08: 25 ug via INTRAVENOUS

## 2012-04-08 MED ORDER — MIDAZOLAM HCL 2 MG/2ML IJ SOLN
INTRAMUSCULAR | Status: AC | PRN
  Administered 2012-04-08: 0.5 mg via INTRAVENOUS
  Administered 2012-04-08: 1 mg via INTRAVENOUS

## 2012-04-08 MED ORDER — FENTANYL CITRATE 0.05 MG/ML IJ SOLN
INTRAMUSCULAR | Status: AC
  Filled 2012-04-08: qty 4

## 2012-04-08 NOTE — H&P (Signed)
Brandy Ramos is an 61 y.o. female.   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  Past Medical History  Diagnosis Date  . Anxiety state, unspecified 06/16/2007  . DEPRESSION 11/04/2007  . HYPERTENSION 12/11/2008  . INSOMNIA 06/16/2007  . 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 OSTEOPOROSIS 11/04/2007  . Palpitations 07/11/2008  . SHOULDER PAIN, RIGHT 05/29/2008    Past Surgical History  Procedure Date  . Cesarean section   . Skin graft 2010    right maxillectomy    Family History  Problem Relation Age of Onset  . Heart disease Mother   . Hypertension Mother    Social History:  reports that she has never smoked. She has never used smokeless tobacco. She reports that she drinks alcohol. She reports that she does not use illicit drugs.  Allergies: No Known Allergies  Current outpatient prescriptions:hydrochlorothiazide (HYDRODIURIL) 25 MG tablet, Take 1 tablet (25 mg total) by mouth daily., Disp: 90 tablet, Rfl: 6;  LORazepam (ATIVAN) 0.5 MG tablet, Take 1 tablet (0.5 mg total) by mouth 2 (two) times daily as needed. anxiety, Disp: 60 tablet, Rfl: 3 Current facility-administered medications:0.9 %  sodium chloride infusion, , Intravenous, Once, Robet Leu, PA   Results for orders placed during the hospital encounter of 04/08/12 (from the past 48 hour(s))  CBC     Status: Abnormal   Collection Time   04/08/12  8:50 AM      Component Value Range Comment   WBC 6.3  4.0 - 10.5 K/uL    RBC 5.76 (*) 3.87 - 5.11 MIL/uL    Hemoglobin 13.6  12.0 - 15.0 g/dL    HCT 62.9  52.8 - 41.3 %    MCV 68.6 (*) 78.0 - 100.0 fL    MCH 23.6 (*) 26.0 - 34.0 pg    MCHC 34.4  30.0 - 36.0 g/dL    RDW 24.4  01.0 - 27.2 %    Platelets 229  150 - 400 K/uL    Results for orders placed during the hospital encounter of  04/08/12  APTT      Component Value Range   aPTT 32  24 - 37 seconds  CBC      Component Value Range   WBC 6.3  4.0 - 10.5 K/uL   RBC 5.76 (*) 3.87 - 5.11 MIL/uL   Hemoglobin 13.6  12.0 - 15.0 g/dL   HCT 53.6  64.4 - 03.4 %   MCV 68.6 (*) 78.0 - 100.0 fL   MCH 23.6 (*) 26.0 - 34.0 pg   MCHC 34.4  30.0 - 36.0 g/dL   RDW 74.2  59.5 - 63.8 %   Platelets 229  150 - 400 K/uL  PROTIME-INR      Component Value Range   Prothrombin Time 12.7  11.6 - 15.2 seconds   INR 0.96  0.00 - 1.49    Review of Systems  Constitutional: Negative for fever and chills.  Respiratory: Negative for cough and shortness of breath.   Cardiovascular: Negative for chest pain.  Gastrointestinal: Negative for nausea, vomiting and abdominal pain.  Musculoskeletal: Negative for back pain.  Neurological: Negative for headaches.  Endo/Heme/Allergies: Does not bruise/bleed easily.    Blood pressure 159/91, pulse 80, temperature 97.6 F (36.4 C), temperature source Oral, resp. rate 18, height 5' 3.5" (1.613 m),  weight 123 lb (55.792 kg), SpO2 99.00%. Physical Exam  Constitutional: She is oriented to person, place, and time. She appears well-developed and well-nourished.  Neck:       Palpable , enlarged , mildy tender rt submandibular nodule/LN  Respiratory: Effort normal and breath sounds normal.  GI: Soft. Bowel sounds are normal. There is no tenderness.  Musculoskeletal: Normal range of motion. She exhibits no edema.  Neurological: She is alert and oriented to person, place, and time.     Assessment/Plan Pt with hx of right retromolar trigone low grade adenocarcinoma, now with enlarged rt submandibular lymph node. Plan is for US guided biopsy of the submandibular lymph node today. Details/risks of procedure d/w pt with her understanding and consent.  Britnie Colville,D KEVIN 04/08/2012, 10:02 AM

## 2012-04-08 NOTE — Procedures (Signed)
R Submandib LN Bx

## 2012-04-08 NOTE — ED Notes (Signed)
O2 d/c 

## 2012-04-11 NOTE — Telephone Encounter (Signed)
Pt aware form complete and faxed - will scan copy to chart

## 2012-04-15 ENCOUNTER — Telehealth: Payer: Self-pay | Admitting: Radiation Oncology

## 2012-04-15 NOTE — Telephone Encounter (Signed)
Faxed path 04/08/12 to Dr. Hezzie Bump at [facility] per Dr. [name].  Received confirmation.

## 2012-04-19 ENCOUNTER — Telehealth: Payer: Self-pay | Admitting: Radiation Oncology

## 2012-04-19 NOTE — Telephone Encounter (Signed)
Discussed preliminary results of pathology with patient. Told her we are waiting for slides from [facility] to arrive for comparison but pathologist is highly suspicious this is the same process.  I will call her as soon as we know more or by Friday with an update.

## 2012-04-22 ENCOUNTER — Telehealth: Payer: Self-pay | Admitting: *Deleted

## 2012-04-22 ENCOUNTER — Other Ambulatory Visit: Payer: Self-pay | Admitting: Radiation Oncology

## 2012-04-22 DIAGNOSIS — C310 Malignant neoplasm of maxillary sinus: Secondary | ICD-10-CM

## 2012-04-22 NOTE — Telephone Encounter (Signed)
CALLED  PATIENT TO INFORM OF TEST, SPOKE WITH PATIENT AND SHE IS AWARE OF THIS TEST 

## 2012-04-25 ENCOUNTER — Telehealth: Payer: Self-pay | Admitting: *Deleted

## 2012-04-25 NOTE — Telephone Encounter (Signed)
CALLED  PATIENT TO INFORM OF TEST, SPOKE WITH PATIENT AND SHE IS AWARE OF THIS TEST 

## 2012-04-27 ENCOUNTER — Ambulatory Visit (HOSPITAL_COMMUNITY): Payer: Managed Care, Other (non HMO)

## 2012-04-28 ENCOUNTER — Telehealth: Payer: Self-pay | Admitting: *Deleted

## 2012-04-28 NOTE — Telephone Encounter (Signed)
CALLED  PATIENT TO INFORM OF TEST, SPOKE WITH PATIENT AND SHE IS AWARE OF THIS TEST 

## 2012-05-02 ENCOUNTER — Ambulatory Visit
Admission: RE | Admit: 2012-05-02 | Discharge: 2012-05-02 | Disposition: A | Discharge status: Home / Self Care | Payer: Managed Care, Other (non HMO) | Source: Ambulatory Visit | Attending: Radiation Oncology | Admitting: Radiation Oncology

## 2012-05-02 DIAGNOSIS — C310 Malignant neoplasm of maxillary sinus: Secondary | ICD-10-CM

## 2012-05-02 MED ORDER — IOHEXOL 300 MG/ML  SOLN
75.0000 mL | Freq: Once | INTRAMUSCULAR | Status: AC | PRN
  Administered 2012-05-02: 75 mL via INTRAVENOUS

## 2012-05-04 ENCOUNTER — Telehealth: Payer: Self-pay | Admitting: Radiation Oncology

## 2012-05-04 NOTE — Telephone Encounter (Signed)
Phoned patient as ordered by Dr. [name]. Reached patient on her cell phone. Informed her that there was no evidence of metastatic disease on her 05/03/2012 chest CT. Patient verbalized understanding and appreciation for the call. Also, patient wants Dr. [name] to be aware she saw Dr. Gordy Levan today and that he does not have her previous CT scan. She goes on to say that her and Dr. Gordy Levan discussed what Dr. [name] and Dr. Gordy Levan had previously discussed. Asked patient to inform this writer when her surgery was scheduled for. Patient reports that she will do so. Routed this message to Dr. [name].

## 2012-05-19 ENCOUNTER — Telehealth: Payer: Self-pay | Admitting: Radiation Oncology

## 2012-05-19 NOTE — Telephone Encounter (Signed)
Faxed NPE 04/19/09, EOT 07/19/09, FUP 03/17/12 to [facility] at [place], [place] [phone].  OK per SW.  Received conf.

## 2012-05-23 HISTORY — PX: OTHER SURGICAL HISTORY: SHX169

## 2012-06-07 ENCOUNTER — Other Ambulatory Visit: Payer: Self-pay

## 2012-06-07 ENCOUNTER — Encounter: Payer: Self-pay | Admitting: Radiation Oncology

## 2012-06-07 NOTE — Progress Notes (Signed)
61 year old female. Referred back by Dr. Hezzie Bump, MD of [facility].  Patient status post right selective neck dissection on 05/23/2012 with post nodes.   NKDA No indication of a pacemaker RT HX: 05/30/2009-07/17/2009 66 Gy at 2 Gy per fraction times 33 fractions to right retromolar trigone

## 2012-06-08 ENCOUNTER — Ambulatory Visit
Admission: RE | Admit: 2012-06-08 | Discharge: 2012-06-08 | Disposition: A | Discharge status: Home / Self Care | Payer: Managed Care, Other (non HMO) | Source: Ambulatory Visit | Attending: Radiation Oncology | Admitting: Radiation Oncology

## 2012-06-08 ENCOUNTER — Encounter: Payer: Self-pay | Admitting: Radiation Oncology

## 2012-06-08 ENCOUNTER — Other Ambulatory Visit: Payer: Self-pay

## 2012-06-08 VITALS — BP 139/86 | HR 80 | Temp 98.3°F | Resp 18 | Ht 64.0 in | Wt 119.0 lb

## 2012-06-08 DIAGNOSIS — C310 Malignant neoplasm of maxillary sinus: Secondary | ICD-10-CM

## 2012-06-08 DIAGNOSIS — C770 Secondary and unspecified malignant neoplasm of lymph nodes of head, face and neck: Secondary | ICD-10-CM | POA: Insufficient documentation

## 2012-06-08 DIAGNOSIS — C410 Malignant neoplasm of bones of skull and face: Secondary | ICD-10-CM | POA: Insufficient documentation

## 2012-06-08 LAB — BUN AND CREATININE (CC13): BUN: 10 mg/dL (ref 7.0–26.0)

## 2012-06-08 NOTE — Progress Notes (Signed)
Complete PATIENT MEASURE OF DISTRESS worksheet with a score of 3 submitted to social work. Left message for Kathrin Penner, LCSW to reach out to patient and offer services since this is reoccurrence.

## 2012-06-08 NOTE — Progress Notes (Addendum)
Patient presents to the clinic today unaccompanied for re consultation with Dr. [name] to discuss the role of radiation therapy s/p right selective neck dissection. Patient alert and oriented to person, place, and time. No distress noted. Steady gait noted. Pleasant affect noted. Patient denies pain. Patient on intermittent leave through December 22 but, continues to be employed full time. Right neck incision healed. Right neck incision well approximated without redness, drainage or edema. Patient reports incision site is only tender when palpated. Patient denies difficulty or painful swallowing. Patient reports that she continues to eat what she wants when she wants. Weight on 04/08/2012 around biopsy time patient weighed 123 and today weighs 119. Patient reports normal taste ability. Patient reports a little dry mouth for which she chews gum. Patient denies thicken saliva. Patient reports that sometimes she doesn't feel hungry but, she eats anyway because she knows she needs. Patient denies nausea, vomiting, headache or dizziness. Mild paralysis of right side mouth noted. Reported all findings to Dr. [name].

## 2012-06-08 NOTE — Progress Notes (Signed)
Department of Radiation Oncology  Phone:  [phone] Fax:        [phone]   Name: Brandy Ramos   DOB: 06-29-1951  MRN: [id number]    Date: 06/08/2012  Follow Up Visit Note  Diagnosis: Recurrent adenocarcinoma of the right neck  Interval since last radiation: 2 years  Interval History: Brandy Ramos presents today for routine followup.  She had her right neck dissection by Dr. Gordy Levan and on November 25. This unfortunately showed 2 out of of 20 lymph nodes positive for metastatic disease. The tumor was adenocarcinoma. 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 She is recovered well from her surgery. She is as you would expect questioning why this came back and if it would come back again. We did do a staging CT scan prior to her surgery which showed no evidence of lung metastases. 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 She asked about a referral to dentistry. She is on intermittent medical leave through December 2. She would like to continue on intermittently but possible while she is undergoing radiation. She had some questions about alternative therapies including the use of antioxidants, juice cleanses, and other holistic approaches. She has some dry mouth.  Allergies: No Known Allergies  Medications:  Current Outpatient Prescriptions  Medication Sig Dispense Refill  . hydrochlorothiazide (HYDRODIURIL) 25 MG tablet Take 1 tablet (25 mg total) by mouth daily.  90 tablet  6  . LORazepam (ATIVAN) 0.5 MG tablet Take 1 tablet (0.5 mg total) by mouth 2 (two) times daily as needed. anxiety  60 tablet  3    Physical Exam:   height is 5\' 4"  (1.626 m) and weight is 119 lb (53.978 kg). Her oral temperature is 98.3 F (36.8 C). Her blood pressure is 139/86 and her pulse is 80. Her  respiration is 18.  She is a pleasant female in no distress sitting comfortably examining table. She has neck incision in her right neck which is well-healed. She has no palpable abnormalities of her left neck.  IMPRESSION: Brandy Ramos is a 61 y.o. female status post right neck dissection for a nodal recurrence of an adenocarcinoma of the maxilla.  PLAN:  I talked Brandy Ramos today. Given the low-grade nature of her maxillary salivary gland tumor we are debating whether even to offer adjuvant radiation at that time. Clearly elective coverage of the neck was not appropriate at that time. It is possible that the lymph node was involved upfront and due to the low-grade nature of this tumor it is grown over the course of several years or this is a component of the original tumor which was higher grade and has metastasized. Regardless with multiple nodes positive we can improve local control by adding adjuvant radiation. I discussed this with Dr. Gordy Levan and he is in agreement. I don't think there is any indication at this time to treat the contralateral neck. I think we can treat level I to level V using I MR T. and spare as much of her oral cavity and contralateral parotid as possible. She may notice some decreased salivary flow on her right side. She also may notice an increase in her trismus. Our for her to dentistry for pretreatment evaluation and also to physical therapy to be fitted for a trismus device if they feel  that would be helpful. We discussed the velocity behind antioxidants. We discussed the use of the kidneys and liver in the body to maintain homeostasis. We discussed that things like to see him in cleansing could not change the pH of the body and large amounts of antioxidant would be filtered out by these organs as well. I referred her to social work. I've scheduled her for simulation next week and will begin her treatment in January at her request. I will be treating her to 60 gray.    Lurline Hare, MD

## 2012-06-08 NOTE — Progress Notes (Signed)
See progress note under physician encounter. 

## 2012-06-08 NOTE — Addendum Note (Signed)
Encounter addended by: Delynn Flavin, RN on: 06/08/2012  7:49 PM<BR>     Documentation filed: Charges VN

## 2012-06-09 ENCOUNTER — Encounter (HOSPITAL_COMMUNITY): Payer: Self-pay | Admitting: Dentistry

## 2012-06-09 ENCOUNTER — Ambulatory Visit (HOSPITAL_COMMUNITY): Payer: Self-pay | Admitting: Dentistry

## 2012-06-09 VITALS — BP 149/88 | HR 72 | Temp 98.2°F

## 2012-06-09 DIAGNOSIS — C801 Malignant (primary) neoplasm, unspecified: Secondary | ICD-10-CM

## 2012-06-09 DIAGNOSIS — M264 Malocclusion, unspecified: Secondary | ICD-10-CM

## 2012-06-09 DIAGNOSIS — K08109 Complete loss of teeth, unspecified cause, unspecified class: Secondary | ICD-10-CM

## 2012-06-09 DIAGNOSIS — M270 Developmental disorders of jaws: Secondary | ICD-10-CM

## 2012-06-09 DIAGNOSIS — K036 Deposits [accretions] on teeth: Secondary | ICD-10-CM

## 2012-06-09 DIAGNOSIS — C799 Secondary malignant neoplasm of unspecified site: Secondary | ICD-10-CM

## 2012-06-09 DIAGNOSIS — K083 Retained dental root: Secondary | ICD-10-CM

## 2012-06-09 DIAGNOSIS — K08199 Complete loss of teeth due to other specified cause, unspecified class: Secondary | ICD-10-CM

## 2012-06-09 DIAGNOSIS — C770 Secondary and unspecified malignant neoplasm of lymph nodes of head, face and neck: Secondary | ICD-10-CM

## 2012-06-09 DIAGNOSIS — K029 Dental caries, unspecified: Secondary | ICD-10-CM

## 2012-06-09 DIAGNOSIS — Z0189 Encounter for other specified special examinations: Secondary | ICD-10-CM

## 2012-06-09 DIAGNOSIS — K0530 Chronic periodontitis, unspecified: Secondary | ICD-10-CM

## 2012-06-09 NOTE — Patient Instructions (Signed)
Return to clinic for insertion of fluoride trays and scatter protection devices along with initial periodontal therapy.  RADIATION THERAPY AND DECISIONS REGARDING YOUR TEETH  Xerostomia (dry mouth) Your salivary glands may be in the filed of radiation.  Radiation may include all or part of your saliva glands.  This will cause your saliva to dry up and you will have a dry mouth.  The dry mouth will be for the rest of your life unless your radiation oncologist tells you otherwise.  Your saliva has many functions:  Saliva wets your tongue for speaking.  It coats your teeth and the inside of your mouth for easier movement.  It helps with chewing and swallowing food.  It helps clean away harmful acid and toxic products made by the germs in your mouth, therefore it helps prevent cavities.  It kills some germs in your mouth and helps to prevent gum disease.  It helps to carry flavor to your taste buds.  Once you have lost your saliva you will be at higher risk for tooth decay and gum disease.  What can be done to help improve your mouth when there's not enough saliva:  1.  Your dentist may give a prescription for Salagen.  It will not bring back all of your saliva but may bring back some of it.  Also your saliva may be thick and ropy or white and foamy. It will not feel like it use to feel.  2.  You will need to swish with water every time your mouth feels dry.  YOU CANNOT suck on any cough drops, mints, lemon drops, candy, vitamin C or any other products.  You cannot use anything other than water to make your mouth feel less dry.  If you want to drink anything else you have to drink it all at once and brush afterwards.  Be sure to discuss the details of your diet habits with your dentist or hygienist.  Radiation caries: This is decay that happens very quickly once your mouth is very dry due to radiation therapy.  Normally cavities take six months to two years to become a problem.  When you have  dry mouth cavities may take as little as eight weeks to cause you a problem.  This is why dental check ups every two months are necessary as long as you have a dry mouth. Radiation caries typically, but not always, start at your gum line where it is hard to see the cavity.  It is therefore also hard to fill these cavities adequately.  This high rate of cavities happens because your mouth no longer has saliva and therefore the acid made by the germs starts the decay process.  Whenever you eat anything the germs in your mouth change the food into acid.  The acid then burns a small hole in your tooth.  This small hole is the beginning of a cavity.  If this is not treated then it will grow bigger and become a cavity.  The way to avoid this hole getting bigger is to use fluoride every evening as prescribed by your dentist.  You have to make sure that your teeth are very clean before you use the fluoride.  This fluoride in turn will strengthen your teeth and prepare them for another day of fighting acid.  If you develop radiation caries many times the damage is so large that you will have to have all your teeth removed.  This could be a big problem if  some of these teeth are in the field of radiation.  Further details of why this could be a big problem will follow.  (See Osteoradionecrosis).  Loss of taste (dysgeusia) This happens to varying degrees once you've had radiation therapy to your jaw region.  Many times taste is not completely lost but becomes limited.  The loss of taste is mostly due to radiation affecting your taste buds.  However if you have no saliva in your mouth to carry the flavor to your taste buds it would be difficult for your taste buds to taste anything.  That is why using water or a prescription for Salagen prior to meals and during meals may help with some of the taste.  Keep in mind that taste generally returns very slowly over the course of several months or several years after radiation  therapy.  Don't give up hope.  Trismus According to your Radiation Oncologist your TMJ or jaw joints are going to be partially or fully in the field of radiation.  This means that over time the muscles that help you open and close your mouth may get stiff.  This will potentially result in your not being able to open your mouth wide enough or as wide as you can open it now.  Le me give you an example of how slowly this happens and how unaware people are of it.  A gentlemen that had radiation therapy two years ago came back to me complaining that bananas are just too large for him to be able to fit them in between his teeth.  He was not able to open wide enough to bite into a banana.  This happens slowly and over a period of time.  What do we do to try and prevent this?  Your dentist will probably give you a stack of sticks called a trismus exercise device .  This stack will help your remind your muscles and your jaw joint to open up to the same distance every day.  Use these sticks every morning when you wake up according to the instructions given by the dentist.   You must use these sticks for at least one to two years after radiation therapy.  The reason for that is because it happens so slowly and keeps going on for about two years after radiation therapy.  Your hospital dentist will help you monitor your mouth opening and make sure that it's not getting smaller.  Osteoradionecrosis (ORN) This is a condition where your jaw bone after having had radiation therapy becomes very dry.  It has very little blood supply to keep it alive.  If you develop a cavity that turns into an abscess or an infection then the jaw bone does not have enough blood supply to help fight the infection.  At this point it is very likely that the infection could cause the death of your jaw bone.  When you have dead bone it has to be removed.  Therefore you might end up having to have surgery to remove part of your jaw bone, the part of  the jaw bone that has been affected.   Healing is also a problem if you are to have surgery in the areas where the bone has had radiation therapy.  The same reasons apply.  If you have surgery you need more blood supply which is not available.  When blood supply and oxygen are not available again, there is a chance for the bone to die.  Occasionally  ORN happens on its own with no obvious reason.  This is quite rare.  We believe that patients who continue to smoke and/or drink alcohol have a higher chance of having this bone problem.  Therefore once your jaw bone has had radiation therapy if there are any teeth in that area, you should never have them pulled.  You should also never have any surgery on your teeth or gums in that area unless the oral surgeon or Periodontist is aware of your history of radiation. There is some expensive management techniques that might be used to limit your risks.  The risks for ORN either from infection or spontaneous ( or on it's own) are life long.    TRISMUS  Trismus is a condition where the jaw does not allow the mouth to open as wide as it usually does.  This can happen almost suddenly, or in other cases the process is so slow, it is hard to notice it-until it is too far along.  When the jaw joints and/or muscles have been exposed to radiation treatments, the onset of Trismus is very slow.  This is because the muscles are losing their stretching ability over a long period of time, as long as 2 YEARS after the end of radiation.  It is therefore important to exercise these muscles and joints.  TRISMUS EXERCISES   Stack of tongue depressors measuring the same or a little less than the last documented MIO (Maximum Interincisal Opening).  Secure them with a rubber band on both ends.  Place the stack in the patient's mouth, supporting the other end.  Allow 30 seconds for muscle stretching.  Rest for a few seconds.  Repeat 3-5 times  For all radiation patients,  this exercise is recommended in the mornings and evenings unless otherwise instructed.  The exercise should be done for a period of 2 YEARS after the end of radiation.  MIO should be checked routinely on recall dental visits by the general dentist or the hospital dentist.  The patient is advised to report any changes, soreness, or difficulties encountered when doing the exercises.  FLUORIDE TRAYS PATIENT INSTRUCTIONS    Obtain prescription from the pharmacy.  Don't be surprised if it needs to be ordered.   Be sure to let the pharmacy know when you are close to needing a new refill for them to have it ready for you without interruption of Fluoride use.   The best time to use your Fluoride is before bed time.   You must brush your teeth very well and floss before using the Fluoride in order to get the best use out of the Fluoride treatments.   Place 1 drop of Fluoride gel per tooth in the tray.   Place the tray on your lower teeth and/or your upper teeth.  Make sure the trays are seated all the way.  Remember, they only fit one way on your teeth.   Insert for 5 full minutes.   At the end of the 5 minutes, take the trays out.  SPIT OUT excess. .    Do NOT rinse your mouth!    Do NOT eat or drink after treatments for at least 30 minutes.  This is why the best time for your treatments is before bedtime.    Clean the inside of your Fluoride trays using COLD WATER and a toothbrush.    In order to keep your Trays from discoloring and free from odors, soak them overnight in denture cleaners  such as Efferdent.  Do not use bleach or non denture products.    Store the trays in a safe dry place AWAY from any heat until your next treatment.    Bring the trays with you for your next dental check-up.  The dentist will confirm their fit.    If anything happens to your Fluoride trays, or they don't fit as well after any dental work, please let us know as soon as  possible.

## 2012-06-09 NOTE — Progress Notes (Addendum)
DENTAL CONSULTATION  Date of Consultation:  06/09/2012 Patient Name:   Brandy Ramos Date of Birth:   1951-01-10 Medical Record Number: [id number]  VITALS: BP 149/88  Pulse 72  Temp 98.2 F (36.8 C) (Oral)   CHIEF COMPLAINT: The patient was referred for a pre radiation therapy dental evaluation.  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 Patient underwent surgical resection followed by postoperative radiation therapy with Dr. [name]. Patient then had metastasis to the right neck. Biopsy was obtained and consistent with previous pleomorphic adenocarcinoma. Patient underwent right radical neck dissection on 05/23/2012 with Dr. Hezzie Bump. Patient now with anticipated radiation therapy with Dr. [name]. Patient is now seen as part of a preradiation therapy dental protocol.  Patient currently denies acute toothache, swellings, or abscesses. Patient was last seen by a Dentist in 2010 for fabrication of a temporary obturator by Dr. Ernesta Amble at [facility]. The patient no longer wears the obturator by report as this was a temporary obturator.  Patient did not followup for definitive obturator by her report.  Patient has not seen a dentist for regular dental care since that time.  PMH: Past Medical History  Diagnosis Date  . Anxiety state, unspecified 06/16/2007  . DEPRESSION 11/04/2007  . HYPERTENSION 12/11/2008  . INSOMNIA 06/16/2007  . 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 OSTEOPOROSIS 11/04/2007    S/P 5 years of Fosamax therapy  . Palpitations 07/11/2008  . SHOULDER PAIN, RIGHT 05/29/2008  . 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 S/P radiation > 12 weeks     completed radiation therapy January 2011    PSH: Past Surgical History  Procedure Date  . Cesarean section   . Skin  graft 2010    right maxillectomy  . Right partial maxillectomy 02/2009  . Right selective neck dissection 05/23/2012    ALLERGIES: No Known Allergies  MEDICATIONS: Current Outpatient Prescriptions  Medication Sig Dispense Refill  . Calcium Carbonate-Vitamin D (CALCIUM 600/VITAMIN D) 600-400 MG-UNIT per chew tablet Chew 1 tablet by mouth daily.      . hydrochlorothiazide (HYDRODIURIL) 25 MG tablet Take 1 tablet (25 mg total) by mouth daily.  90 tablet  6  . LORazepam (ATIVAN) 0.5 MG tablet Take 1 tablet (0.5 mg total) by mouth 2 (two) times daily as needed. anxiety  60 tablet  3  . Multiple Vitamins-Minerals (MULTIVITAMIN PO) Take 1 tablet by mouth daily.        LABS: Lab Results  Component Value Date   WBC 6.3 04/08/2012   HGB 13.6 04/08/2012   HCT 39.5 04/08/2012   MCV 68.6* 04/08/2012   PLT 229 04/08/2012      Component Value Date/Time   NA 140 10/19/2011 0850   K 3.9 10/19/2011 0850   CL 105 10/19/2011 0850   CO2 26 10/19/2011 0850   GLUCOSE 98 10/19/2011 0850   GLUCOSE 110* 04/14/2006 1035   BUN 10.0 06/08/2012 1118   BUN 15 10/19/2011 0850   CREATININE 0.8 06/08/2012 1118   CREATININE 1.1 10/19/2011 0850   CALCIUM 9.1 10/19/2011 0850   GFRNONAA 69 11/03/2007 0833   GFRAA 83 11/03/2007 0833   Lab Results  Component Value Date   INR 0.96 04/08/2012   No results found for this basename: PTT    SOCIAL HISTORY: History   Social  History  . Marital Status: Married    Spouse Name: N/A    Number of Children: N/A  . Years of Education: N/A   Occupational History  . Not on file.   Social History Main Topics  . Smoking status: Never Smoker   . Smokeless tobacco: Never Used  . Alcohol Use: Yes     Comment: very rare glass of wine, occ'l beer  . Drug Use: No  . Sexually Active: Not on file   Other Topics Concern  . Not on file   Social History Narrative   The patient is married and has 3 children.The patient is a nonsmoker.  Patient has never used smokeless tobacco  products.The patient with rare use of alcohol, drinking wine or beer.The patient's mother is alive at age is 63 with a history of heart disease, multiple stents, and hypertension.The father passed away at the age of 65 from Colon Cancer    FAMILY HISTORY: Family History  Problem Relation Age of Onset  . Heart disease Mother   . Hypertension Mother   . Cancer Father     REVIEW OF SYSTEMS: Reviewed with patient and included in dental consultation record.  DENTAL HISTORY: CHIEF COMPLAINT: The patient was referred for a pre radiation therapy dental evaluation.  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 Patient underwent surgical resection followed by postoperative radiation therapy with Dr. [name]. Patient then had metastasis to the right neck. Biopsy was obtained and consistent with previous pleomorphic adenocarcinoma. Patient underwent right radical neck dissection on 05/23/2012 with Dr. Hezzie Bump. Patient now with anticipated radiation therapy with Dr. [name]. Patient is now seen as part of a preradiation therapy dental protocol.  Patient currently denies acute toothache, swellings, or abscesses. Patient was last seen by a Dentist in 2010 for fabrication of a temporary obturator by Dr. Ernesta Amble at [facility]. The patient no longer wears the obturator by report as this was a temporary obturator.  Patient did not followup for definitive obturator by her report.  Patient has not seen a dentist for regular dental care since that time.  DENTAL EXAMINATION:  GENERAL: Patient is a well-developed, well-nourished female in no acute distress. HEAD AND NECK: The right neck is consistent with previous neck dissection. I am unable to palpate any left neck lymphadenopathy. The patient denies acute TMJ symptoms. INTRAORAL EXAM: The patient has incipient xerostomia. Patient has bilateral mandibular lingual  tori.  The patient has a surgical defect in the area of the upper right maxilla. The patient has a decreased maximum interincisal opening of 28 mm with symptoms of trismus. DENTITION: The patient is missing tooth numbers 1, 2, 3, 4 there removed with a surgical resection. The patient is also missing tooth numbers 16, 17, 30, and 32. Tooth #5 is present as a retained root segment with previous root canal therapy. PERIODONTAL: Patient with chronic periodontitis with plaque and calculus accumulations, selective areas of gingival recession and incipient tooth mobility. DENTAL CARIES/SUBOPTIMAL RESTORATIONS: There are multiple dental caries and suboptimal dental restorations as noted per dental charting form. ENDODONTIC: The patient has had previous root canal therapy on retained root #5.  The patient currently denies history of acute pulpitis symptoms. CROWN AND BRIDGE: The patient has a crown on tooth #31. This is acceptable. Patient could be evaluated further crown restorations. PROSTHODONTIC: The patient had a history of a temporary acrylic obturator.  The patient no longer  wears the obturator. OCCLUSION: Patient with a poor occlusal scheme secondary to multiple missing teeth and lack of replacement of the missing teeth with dental prosthesis/obturator.  RADIOGRAPHIC INTERPRETATION: An orthopantogram was obtained and supplemented with 12 periapical radiographs and 3 bitewings The patient is missing tooth numbers 1, 2, 3, 4, 16, 17, 30, and 32.  Tooth #5 is present as a retained root segment with previous root canal therapy.  Patient with incipient to moderate bone loss.  Patient with radiolucency consistent with previous upper right partial maxillectomy.  No obvious periapical radiolucencies are noted. Multiple amalgam and crown restorations are noted.  ASSESSMENTS: 1. Chronic periodontitis with bone loss 2. Accretions 3. Retained root segment in the area of #5 with previous root canal therapy 4.  Selective areas of gingival recession 5. Incipient tooth mobility 6. Dental caries and suboptimal dental restorations 7.  Multiple missing teeth 8. Supra-eruption and drifting of the unopposed teeth into the edentulous areas 9. Multiple diastemas 10. History of temporary obturator that the patient no longer wears 11. Decreased maximum interincisal opening of 28 mm 12. Trismus 13. Bilateral mandibular lingual tori 14. Incipient xerostomia 14. History of previous radiation therapy to the upper right maxilla with potential for osteoradionecrosis with possible dental extraction of tooth #5. 15. History of 5 years oral bisphosphonate therapy with the risk for osteonecrosis of the jaw related to the previous bisphosphonate therapy with invasive dental procedures.    PLAN/RECOMMENDATIONS: 1. I discussed the risks, benefits, and complications of various treatment options with the patient in relationship to her medical and dental conditions, anticipated radiation therapy and radiation therapy side effects to include xerostomia, radiation caries, trismus, mucositis, taste changes, gum and jawbone changes, and the risk for infection and osteoradionecrosis. We also discussed history of 5 years of oral bisphosphonate therapy with the risk for osteonecrosis of the jaw related to previous oral bisphosphonate therapy with invasive dental procedures.  We discussed various treatment options to include no treatment, multiple extractions with alveoloplasty, pre-prosthetic surgery as indicated, periodontal therapy, dental restorations, root canal therapy, crown and bridge therapy, implant therapy, and replacement of missing teeth as indicated. The patient currently wishes to proceed with initial periodontal therapy and fabrication of upper and lower fluoride trays and scatter protection devices.  The patient also has been referred to physical therapy for possible Therabite appliance for her current trismus and decreased  maximum interincisal opening.  After radiation therapy, the patient will be referred to a prosthodontist for evaluation for obturator fabrication as well as the final disposition for treatment of tooth #5 that may require extraction or could be restored with dental coping. Referral for hyperbaric oxygen therapy will be considered if dental extraction is needed for tooth #5. The patient did agree for impressions today for fabrication of the fluoride trays and scatter protection devices. A prescription for FluoroSHIELD was faxed to the pharmacy.  2. Discussion of findings with medical team and coordination of future medical and dental care.  Charlynne Pander, DDS

## 2012-06-10 MED ORDER — SODIUM FLUORIDE 1.1 % DT GEL: DENTAL | Status: AC

## 2012-06-10 NOTE — Addendum Note (Signed)
Addended by: Charlynne Pander on: 06/10/2012 08:11 AM   Modules accepted: Orders

## 2012-06-13 ENCOUNTER — Ambulatory Visit (HOSPITAL_COMMUNITY): Payer: Self-pay | Admitting: Dentistry

## 2012-06-13 ENCOUNTER — Encounter (HOSPITAL_COMMUNITY): Payer: Self-pay | Admitting: Dentistry

## 2012-06-13 VITALS — BP 160/89 | HR 77 | Temp 97.8°F

## 2012-06-13 DIAGNOSIS — Z0189 Encounter for other specified special examinations: Secondary | ICD-10-CM

## 2012-06-13 DIAGNOSIS — K0530 Chronic periodontitis, unspecified: Secondary | ICD-10-CM

## 2012-06-13 DIAGNOSIS — K036 Deposits [accretions] on teeth: Secondary | ICD-10-CM

## 2012-06-13 DIAGNOSIS — C770 Secondary and unspecified malignant neoplasm of lymph nodes of head, face and neck: Secondary | ICD-10-CM

## 2012-06-13 DIAGNOSIS — C801 Malignant (primary) neoplasm, unspecified: Secondary | ICD-10-CM

## 2012-06-13 NOTE — Progress Notes (Signed)
06/13/2012  Patient:            Brandy Ramos Date of Birth:  January 29, 1951 MRN:                [id number]  BP 160/89  Pulse 77  Temp 97.8 F (36.6 C) (Oral)  [name] now presents for insertion of upper and lower fluoride trays and scatter protection devices. This will be followed by initial periodontal therapy.  PROCEDURES: Appliances were tried in and adjusted as needed. [name]. Appliances had less than ideal retention and stability due to the short clinical crowns with minimal undercut, but they are clinically acceptable. Trismus device was previously fabricated at 28 mm and using 17 sticks. Postop instructions were provided and a written and verbal format concerning the use and care of appliances. All questions were answered.  Gross debridement performed with KaVo Sonic scaler and selective hand currettes. [name]. Fluoride therapy for 4 minutes. Oral hygiene instructions provided. Patient tolerated procedure well.  Patient to return to clinic for periodic oral examination in approximately 2-3 weeks during radiation therapy. Patient to call if questions or problems arise before then.   Charlynne Pander, DDS

## 2012-06-13 NOTE — Patient Instructions (Signed)
Return to clinic in 2-3 weeks for periodic oral examination during radiation therapy. Patient to call if problems arise before then.  Dr. Kristin Bruins  FLUORIDE TRAYS PATIENT INSTRUCTIONS    Obtain prescription from the pharmacy.  Don't be surprised if it needs to be ordered.   Be sure to let the pharmacy know when you are close to needing a new refill for them to have it ready for you without interruption of Fluoride use.   The best time to use your Fluoride is before bed time.   You must brush your teeth very well and floss before using the Fluoride in order to get the best use out of the Fluoride treatments.   Place 1 drop of Fluoride gel per tooth in the tray.   Place the tray on your lower teeth and/or your upper teeth.  Make sure the trays are seated all the way.  Remember, they only fit one way on your teeth.   Insert for 5 full minutes.   At the end of the 5 minutes, take the trays out.  SPIT OUT excess. .    Do NOT rinse your mouth!    Do NOT eat or drink after treatments for at least 30 minutes.  This is why the best time for your treatments is before bedtime.    Clean the inside of your Fluoride trays using COLD WATER and a toothbrush.    In order to keep your Trays from discoloring and free from odors, soak them overnight in denture cleaners such as Efferdent.  Do not use bleach or non denture products.    Store the trays in a safe dry place AWAY from any heat until your next treatment.    Bring the trays with you for your next dental check-up.  The dentist will confirm their fit.    If anything happens to your Fluoride trays, or they don't fit as well after any dental work, please let us know as soon as possible.

## 2012-06-15 ENCOUNTER — Ambulatory Visit
Admission: RE | Admit: 2012-06-15 | Discharge: 2012-06-15 | Disposition: A | Discharge status: Home / Self Care | Payer: Managed Care, Other (non HMO) | Source: Ambulatory Visit | Attending: Radiation Oncology | Admitting: Radiation Oncology

## 2012-06-15 DIAGNOSIS — C310 Malignant neoplasm of maxillary sinus: Secondary | ICD-10-CM | POA: Insufficient documentation

## 2012-06-16 ENCOUNTER — Ambulatory Visit
Admission: RE | Admit: 2012-06-16 | Discharge: 2012-06-16 | Disposition: A | Discharge status: Home / Self Care | Payer: Managed Care, Other (non HMO) | Source: Ambulatory Visit | Attending: Radiation Oncology | Admitting: Radiation Oncology

## 2012-06-16 ENCOUNTER — Telehealth: Payer: Self-pay | Admitting: Radiation Oncology

## 2012-06-16 DIAGNOSIS — C310 Malignant neoplasm of maxillary sinus: Secondary | ICD-10-CM

## 2012-06-16 MED ORDER — SODIUM CHLORIDE 0.9 % IJ SOLN
10.0000 mL | Freq: Once | INTRAMUSCULAR | Status: AC
  Administered 2012-06-16: 10 mL via INTRAVENOUS

## 2012-06-16 NOTE — Progress Notes (Signed)
Verified pt is not allergic to IV contrast. Peripheral IV started per protocol in right dorsal forearm w/#22 angiocath. Pt tol well. Walked pt to ct sim.

## 2012-06-16 NOTE — Telephone Encounter (Signed)
Met with patient to discuss RO billing. Pt has [employer] and had some financial concerns, due to [employer] being out of net work. Pt was given and EPP & MCD application to complete and bring back.  Dx: 160.2  Attending Rad:  SW  Rad Tx: IMRT x 40  FMLA papers given to Dr. [name] for completion.

## 2012-06-16 NOTE — Progress Notes (Signed)
IV d/ced s/p ct sim.  Site w/o redness, swelling. Pt tol well. Pt will return to rad onc dept 07/04/12.

## 2012-06-16 NOTE — Progress Notes (Signed)
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  Identity was confirmed.  All relevant records and images related to the planned course of therapy were reviewed.  Then, the patient was positioned in a stable reproducible clinical set-up for radiation therapy using an aquaplast mask.  IV contrast was administered and CT images were obtained.  Skin markings were placed.  The CT images were loaded into the planning software where the target and avoidance structures were contoured.  The patient's previous PET scan was fused with the planning CT to aid in target delineation. The radiation prescription was entered and confirmed.   TREATMENT PLANNING NOTE:  Treatment planning then occurred. I have requested : Intensity Modulated Radiotherapy (IMRT) is medically necessary for this case for the following reason:  Dose homogeneity and treatment of a head and neck site.   Special treatment procedure will be performed as she has been previously treated to this area (right maxilla).  She will be carefully monitored for increased toxicities of treatment including weekly labs and nutrition follow up. A cumulative dose plan will be constructed to ensure safety.

## 2012-06-23 ENCOUNTER — Encounter: Payer: Self-pay | Admitting: Radiation Oncology

## 2012-06-23 NOTE — Progress Notes (Signed)
Patient came in today and signed FMLA paperwork that Dr. [name] filled out.  Faxed to [place] at [phone].

## 2012-06-29 LAB — HM COLONOSCOPY

## 2012-07-04 ENCOUNTER — Telehealth: Payer: Self-pay | Admitting: Radiation Oncology

## 2012-07-04 ENCOUNTER — Ambulatory Visit
Admission: RE | Admit: 2012-07-04 | Discharge: 2012-07-04 | Disposition: A | Discharge status: Home / Self Care | Payer: Managed Care, Other (non HMO) | Source: Ambulatory Visit | Attending: Radiation Oncology | Admitting: Radiation Oncology

## 2012-07-04 NOTE — Telephone Encounter (Signed)
Pt called to have FMLA papers re-faxed w new claim nbr.  [employer]  Old Nbr: [id number] New Nbr: [id number]  Faxed today @ [phone]

## 2012-07-05 ENCOUNTER — Ambulatory Visit
Admission: RE | Admit: 2012-07-05 | Discharge: 2012-07-05 | Disposition: A | Discharge status: Home / Self Care | Payer: Managed Care, Other (non HMO) | Source: Ambulatory Visit | Attending: Radiation Oncology | Admitting: Radiation Oncology

## 2012-07-05 ENCOUNTER — Encounter: Payer: Self-pay | Admitting: Radiation Oncology

## 2012-07-05 VITALS — BP 149/88 | HR 73 | Temp 97.8°F | Wt 120.7 lb

## 2012-07-05 DIAGNOSIS — C310 Malignant neoplasm of maxillary sinus: Secondary | ICD-10-CM

## 2012-07-05 MED ORDER — BIAFINE EX EMUL: Freq: Three times a day (TID) | CUTANEOUS | Status: DC

## 2012-07-05 NOTE — Progress Notes (Signed)
Ms. Ehrsam has received 2/33 fractions to her right neck.  Currrently, she denies pain and reports that she is eating "good".  She is juicing in the am and eating anything she wants and is not experiencing any dysphagia or odonyphagia.  Education today in regards to management of pain, sore throat, thickened saliva, fatigue, diet and skin and given information sheets as reference.  She stated understanding of material.  Given Biafine for her skin care management routine with instructions to use BID or TID and to ensure product has been on her skin at least 4 hours prior to daily treatment.

## 2012-07-05 NOTE — Progress Notes (Signed)
Weekly Management Note Current Dose: 3.6  Gy  Projected Dose: 59.4 Gy   Narrative:  The patient presents for routine under treatment assessment.  CBCT/MVCT images/Port film x-rays were reviewed.  The chart was checked. Doing well. No complaints.   Physical Findings: Weight: 120 lb 11.2 oz (54.749 kg). Unchanged  Impression:  The patient is tolerating radiation.  Plan:  Continue treatment as planned. RN education performed. We talked about her areas of treatment overlap and the possibility of carotid stenosis/stroke/damage. She wishes to proceed.

## 2012-07-06 ENCOUNTER — Ambulatory Visit
Admission: RE | Admit: 2012-07-06 | Discharge: 2012-07-06 | Disposition: A | Discharge status: Home / Self Care | Payer: Managed Care, Other (non HMO) | Source: Ambulatory Visit | Attending: Radiation Oncology | Admitting: Radiation Oncology

## 2012-07-07 ENCOUNTER — Ambulatory Visit
Admission: RE | Admit: 2012-07-07 | Discharge: 2012-07-07 | Disposition: A | Discharge status: Home / Self Care | Payer: Managed Care, Other (non HMO) | Source: Ambulatory Visit | Attending: Radiation Oncology | Admitting: Radiation Oncology

## 2012-07-08 ENCOUNTER — Ambulatory Visit
Admission: RE | Admit: 2012-07-08 | Discharge: 2012-07-08 | Disposition: A | Discharge status: Home / Self Care | Payer: Managed Care, Other (non HMO) | Source: Ambulatory Visit | Attending: Radiation Oncology | Admitting: Radiation Oncology

## 2012-07-11 ENCOUNTER — Ambulatory Visit
Admission: RE | Admit: 2012-07-11 | Discharge: 2012-07-11 | Disposition: A | Discharge status: Home / Self Care | Payer: Managed Care, Other (non HMO) | Source: Ambulatory Visit | Attending: Radiation Oncology | Admitting: Radiation Oncology

## 2012-07-12 ENCOUNTER — Ambulatory Visit
Admission: RE | Admit: 2012-07-12 | Discharge: 2012-07-12 | Disposition: A | Discharge status: Home / Self Care | Payer: Managed Care, Other (non HMO) | Source: Ambulatory Visit | Attending: Radiation Oncology | Admitting: Radiation Oncology

## 2012-07-12 ENCOUNTER — Encounter: Payer: Self-pay | Admitting: Radiation Oncology

## 2012-07-12 VITALS — BP 136/58 | HR 81 | Temp 98.0°F | Resp 20 | Wt 121.2 lb

## 2012-07-12 DIAGNOSIS — C310 Malignant neoplasm of maxillary sinus: Secondary | ICD-10-CM

## 2012-07-12 NOTE — Progress Notes (Signed)
Pt denies pain, fatigue, loss of appetite, difficulty chewing/ swallowing. She is applying Aloe and Biafine lotion to neck tx area.  Will schedule her to see dietician.

## 2012-07-12 NOTE — Progress Notes (Signed)
Weekly Management Note Current Dose:  12.6 Gy  Projected Dose: 59.4 Gy   Narrative:  The patient presents for routine under treatment assessment.  CBCT/MVCT images/Port film x-rays were reviewed.  The chart was checked. Feeling reluctant to continue treatment. "I'm in a rut. I don't want to be doing this. I don't want this to be happening again." Working half days then going home.  Physical Findings: Weight: 121 lb 3.2 oz (54.976 kg). Unchanged  Impression:  The patient is tolerating radiation.  Plan:  Continue treatment as planned. Discussed follow up with chaplain or SW. Knows Brandy Ramos from [facility].  Patient will contact her for follow up/spiritual guidance.

## 2012-07-13 ENCOUNTER — Ambulatory Visit
Admission: RE | Admit: 2012-07-13 | Discharge: 2012-07-13 | Disposition: A | Discharge status: Home / Self Care | Payer: Managed Care, Other (non HMO) | Source: Ambulatory Visit | Attending: Radiation Oncology | Admitting: Radiation Oncology

## 2012-07-14 ENCOUNTER — Ambulatory Visit
Admission: RE | Admit: 2012-07-14 | Discharge: 2012-07-14 | Disposition: A | Discharge status: Home / Self Care | Payer: Managed Care, Other (non HMO) | Source: Ambulatory Visit | Attending: Radiation Oncology | Admitting: Radiation Oncology

## 2012-07-15 ENCOUNTER — Ambulatory Visit
Admission: RE | Admit: 2012-07-15 | Discharge: 2012-07-15 | Disposition: A | Discharge status: Home / Self Care | Payer: Managed Care, Other (non HMO) | Source: Ambulatory Visit | Attending: Radiation Oncology | Admitting: Radiation Oncology

## 2012-07-18 ENCOUNTER — Ambulatory Visit (HOSPITAL_COMMUNITY): Payer: Self-pay | Admitting: Dentistry

## 2012-07-18 ENCOUNTER — Encounter (HOSPITAL_COMMUNITY): Payer: Self-pay | Admitting: Dentistry

## 2012-07-18 ENCOUNTER — Ambulatory Visit
Admission: RE | Admit: 2012-07-18 | Discharge: 2012-07-18 | Disposition: A | Discharge status: Home / Self Care | Payer: Managed Care, Other (non HMO) | Source: Ambulatory Visit | Attending: Radiation Oncology | Admitting: Radiation Oncology

## 2012-07-18 VITALS — BP 149/85 | HR 73 | Temp 97.7°F

## 2012-07-18 DIAGNOSIS — R634 Abnormal weight loss: Secondary | ICD-10-CM

## 2012-07-18 DIAGNOSIS — Z09 Encounter for follow-up examination after completed treatment for conditions other than malignant neoplasm: Secondary | ICD-10-CM

## 2012-07-18 DIAGNOSIS — C760 Malignant neoplasm of head, face and neck: Secondary | ICD-10-CM

## 2012-07-18 DIAGNOSIS — R252 Cramp and spasm: Secondary | ICD-10-CM

## 2012-07-18 NOTE — Patient Instructions (Addendum)
RECOMMENDATIONS: 1. Brush after meals and at bedtime.  Use fluoride at bedtime. 2. Use trismus exercises as directed. 3. Use Biotene Rinse or salt water/baking soda rinses. 4. Multiple sips of water as needed. 5. Return to clinic one month after radiation therapy has been completed. We will discuss referral to prosthodontist for evaluation for obturator as well as the needs concerning removal of retained root segment #5 with or without hyperbaric oxygen therapy. Call if problems before then.    RADIATION THERAPY AND DECISIONS REGARDING YOUR TEETH  Xerostomia (dry mouth) Your salivary glands may be in the filed of radiation.  Radiation may include all or part of your saliva glands.  This will cause your saliva to dry up and you will have a dry mouth.  The dry mouth will be for the rest of your life unless your radiation oncologist tells you otherwise.  Your saliva has many functions:  Saliva wets your tongue for speaking.  It coats your teeth and the inside of your mouth for easier movement.  It helps with chewing and swallowing food.  It helps clean away harmful acid and toxic products made by the germs in your mouth, therefore it helps prevent cavities.  It kills some germs in your mouth and helps to prevent gum disease.  It helps to carry flavor to your taste buds.  Once you have lost your saliva you will be at higher risk for tooth decay and gum disease.  What can be done to help improve your mouth when there's not enough saliva:  1.  Your dentist may give a prescription for Salagen.  It will not bring back all of your saliva but may bring back some of it.  Also your saliva may be thick and ropy or white and foamy. It will not feel like it use to feel.  2.  You will need to swish with water every time your mouth feels dry.  YOU CANNOT suck on any cough drops, mints, lemon drops, candy, vitamin C or any other products.  You cannot use anything other than water to make your mouth  feel less dry.  If you want to drink anything else you have to drink it all at once and brush afterwards.  Be sure to discuss the details of your diet habits with your dentist or hygienist.  Radiation caries: This is decay that happens very quickly once your mouth is very dry due to radiation therapy.  Normally cavities take six months to two years to become a problem.  When you have dry mouth cavities may take as little as eight weeks to cause you a problem.  This is why dental check ups every two months are necessary as long as you have a dry mouth. Radiation caries typically, but not always, start at your gum line where it is hard to see the cavity.  It is therefore also hard to fill these cavities adequately.  This high rate of cavities happens because your mouth no longer has saliva and therefore the acid made by the germs starts the decay process.  Whenever you eat anything the germs in your mouth change the food into acid.  The acid then burns a small hole in your tooth.  This small hole is the beginning of a cavity.  If this is not treated then it will grow bigger and become a cavity.  The way to avoid this hole getting bigger is to use fluoride every evening as prescribed by your dentist.  You have to make sure that your teeth are very clean before you use the fluoride.  This fluoride in turn will strengthen your teeth and prepare them for another day of fighting acid.  If you develop radiation caries many times the damage is so large that you will have to have all your teeth removed.  This could be a big problem if some of these teeth are in the field of radiation.  Further details of why this could be a big problem will follow.  (See Osteoradionecrosis).  Loss of taste (dysgeusia) This happens to varying degrees once you've had radiation therapy to your jaw region.  Many times taste is not completely lost but becomes limited.  The loss of taste is mostly due to radiation affecting your taste buds.   However if you have no saliva in your mouth to carry the flavor to your taste buds it would be difficult for your taste buds to taste anything.  That is why using water or a prescription for Salagen prior to meals and during meals may help with some of the taste.  Keep in mind that taste generally returns very slowly over the course of several months or several years after radiation therapy.  Don't give up hope.  Trismus According to your Radiation Oncologist your TMJ or jaw joints are going to be partially or fully in the field of radiation.  This means that over time the muscles that help you open and close your mouth may get stiff.  This will potentially result in your not being able to open your mouth wide enough or as wide as you can open it now.  Le me give you an example of how slowly this happens and how unaware people are of it.  A gentlemen that had radiation therapy two years ago came back to me complaining that bananas are just too large for him to be able to fit them in between his teeth.  He was not able to open wide enough to bite into a banana.  This happens slowly and over a period of time.  What do we do to try and prevent this?  Your dentist will probably give you a stack of sticks called a trismus exercise device .  This stack will help your remind your muscles and your jaw joint to open up to the same distance every day.  Use these sticks every morning when you wake up according to the instructions given by the dentist.   You must use these sticks for at least one to two years after radiation therapy.  The reason for that is because it happens so slowly and keeps going on for about two years after radiation therapy.  Your hospital dentist will help you monitor your mouth opening and make sure that it's not getting smaller.  Osteoradionecrosis (ORN) This is a condition where your jaw bone after having had radiation therapy becomes very dry.  It has very little blood supply to keep it  alive.  If you develop a cavity that turns into an abscess or an infection then the jaw bone does not have enough blood supply to help fight the infection.  At this point it is very likely that the infection could cause the death of your jaw bone.  When you have dead bone it has to be removed.  Therefore you might end up having to have surgery to remove part of your jaw bone, the part of the jaw bone that has  been affected.   Healing is also a problem if you are to have surgery in the areas where the bone has had radiation therapy.  The same reasons apply.  If you have surgery you need more blood supply which is not available.  When blood supply and oxygen are not available again, there is a chance for the bone to die.  Occasionally ORN happens on its own with no obvious reason.  This is quite rare.  We believe that patients who continue to smoke and/or drink alcohol have a higher chance of having this bone problem.  Therefore once your jaw bone has had radiation therapy if there are any teeth in that area, you should never have them pulled.  You should also never have any surgery on your teeth or gums in that area unless the oral surgeon or Periodontist is aware of your history of radiation. There is some expensive management techniques that might be used to limit your risks.  The risks for ORN either from infection or spontaneous ( or on it's own) are life long.

## 2012-07-18 NOTE — Progress Notes (Signed)
07/18/2012  Patient:            Brandy Ramos Date of Birth:  1950/11/22 MRN:                [id number]  BP 149/85  Pulse 73  Temp 97.7 F (36.5 C) (Oral)  [name] presents for periodic oral examination during radiation therapy. Patient has completed 10/33 Treatments.  REVIEW OF CHIEF COMPLAINTS:  DRY MOUTH: Incipient xerostomia HARD TO SWALLOW: No  HURT TO SWALLOW: No TASTE CHANGES: Taste is fine SORES IN MOUTH: No TRISMUS: Maximum interincisal opening at 20 mm. Trismus as before. Patient refused referral to physical therapy at this time. WEIGHT: 121 pounds  HOME OH REGIMEN:  BRUSHING: After meals and at bedtime FLOSSING: 1 times a day RINSING: Using Biotene rinses FLUORIDE: Using fluoride at bedtime with no problems TRISMUS EXERCISES:  Maximum interincisal opening: 28 mm as before. Patient doing exercises daily.   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 Brush after meals and at bedtime.  Use fluoride at bedtime. 2. Use trismus exercises as directed. 3. Use Biotene Rinse or salt water/baking soda rinses. 4. Multiple sips of water as needed. 5. Return to clinic one month after radiation therapy has been completed. We will discuss referral to prosthodontist for evaluation for obturator as well as the needs concerning removal of retained root segment #5 with or without hyperbaric oxygen therapy. Call if problems before then.  Charlynne Pander 07/18/2012

## 2012-07-19 ENCOUNTER — Encounter: Payer: Self-pay | Admitting: Radiation Oncology

## 2012-07-19 ENCOUNTER — Ambulatory Visit
Admission: RE | Admit: 2012-07-19 | Discharge: 2012-07-19 | Disposition: A | Discharge status: Home / Self Care | Payer: Managed Care, Other (non HMO) | Source: Ambulatory Visit | Attending: Radiation Oncology | Admitting: Radiation Oncology

## 2012-07-19 VITALS — BP 147/91 | HR 69 | Resp 18 | Wt 120.1 lb

## 2012-07-19 DIAGNOSIS — C310 Malignant neoplasm of maxillary sinus: Secondary | ICD-10-CM

## 2012-07-19 MED ORDER — HYDROCODONE-ACETAMINOPHEN 7.5-325 MG/15ML PO SOLN: 15.0000 mL | Freq: Four times a day (QID) | ORAL | Status: DC | PRN

## 2012-07-19 MED ORDER — MAGIC MOUTHWASH W/LIDOCAINE: 5.0000 mL | Freq: Three times a day (TID) | ORAL | Status: DC

## 2012-07-19 NOTE — Progress Notes (Signed)
Patient presents to the clinic today unaccompanied for PUT with Dr. [name]. Patient is alert and oriented to person, place, and time. No distress noted. Steady gait noted. Pleasant affect noted. Patient denies pain at this time. Patient reports a mild headache today. Patient reports dry mouth despite using biotene. Patient reports thick rope like saliva. Patient difficulty swallowing coarse foods. Patient reports a mild sore throat. Patient denies hearing or vision changes. Patient denies nausea, vomiting or dizziness. Raspy voice noted. Reported all findings to Dr. [name].

## 2012-07-19 NOTE — Progress Notes (Signed)
Weekly Management Note Current Dose:  21.6 Gy  Projected Dose: 59.4 Gy   Narrative:  The patient presents for routine under treatment assessment.  CBCT/MVCT images/Port film x-rays were reviewed.  The chart was checked. Sore throat starting. Dry ropy saliva. Attitude better.   Physical Findings: Weight: 120 lb 1.6 oz (54.477 kg). Unchanged  Impression:  The patient is tolerating radiation.  Plan:  Continue treatment as planned. Gave script for MMW with lidocaine and hycet. Encouraged her to continue with treatment.

## 2012-07-20 ENCOUNTER — Encounter: Payer: Self-pay | Admitting: Nutrition

## 2012-07-20 ENCOUNTER — Ambulatory Visit
Admission: RE | Admit: 2012-07-20 | Discharge: 2012-07-20 | Disposition: A | Discharge status: Home / Self Care | Payer: Managed Care, Other (non HMO) | Source: Ambulatory Visit | Attending: Radiation Oncology | Admitting: Radiation Oncology

## 2012-07-20 NOTE — Progress Notes (Signed)
Patient canceled nutrition appointment. She said she would call and reschedule at her convenience.

## 2012-07-21 ENCOUNTER — Ambulatory Visit
Admission: RE | Admit: 2012-07-21 | Discharge: 2012-07-21 | Disposition: A | Discharge status: Home / Self Care | Payer: Managed Care, Other (non HMO) | Source: Ambulatory Visit | Attending: Radiation Oncology | Admitting: Radiation Oncology

## 2012-07-22 ENCOUNTER — Ambulatory Visit
Admission: RE | Admit: 2012-07-22 | Discharge: 2012-07-22 | Disposition: A | Discharge status: Home / Self Care | Payer: Managed Care, Other (non HMO) | Source: Ambulatory Visit | Attending: Radiation Oncology | Admitting: Radiation Oncology

## 2012-07-25 ENCOUNTER — Ambulatory Visit: Payer: Managed Care, Other (non HMO)

## 2012-07-25 ENCOUNTER — Ambulatory Visit
Admission: RE | Admit: 2012-07-25 | Discharge: 2012-07-25 | Disposition: A | Discharge status: Home / Self Care | Payer: Managed Care, Other (non HMO) | Source: Ambulatory Visit | Attending: Radiation Oncology | Admitting: Radiation Oncology

## 2012-07-26 ENCOUNTER — Ambulatory Visit
Admission: RE | Admit: 2012-07-26 | Discharge: 2012-07-26 | Disposition: A | Discharge status: Home / Self Care | Payer: Managed Care, Other (non HMO) | Source: Ambulatory Visit | Attending: Radiation Oncology | Admitting: Radiation Oncology

## 2012-07-26 ENCOUNTER — Ambulatory Visit: Payer: Managed Care, Other (non HMO)

## 2012-07-26 ENCOUNTER — Encounter: Payer: Self-pay | Admitting: Radiation Oncology

## 2012-07-26 VITALS — BP 156/91 | HR 74

## 2012-07-26 DIAGNOSIS — C310 Malignant neoplasm of maxillary sinus: Secondary | ICD-10-CM

## 2012-07-26 NOTE — Progress Notes (Signed)
Weekly Management Note Current Dose: 30.6 Gy  Projected Dose: 59.4 Gy   Narrative:  The patient presents for routine under treatment assessment.  CBCT/MVCT images/Port film x-rays were reviewed.  The chart was checked. Doing well. Eating soft foods  Physical Findings: Weight:  . Unchanged. No mucocitis  Impression:  The patient is tolerating radiation.  Plan:  Continue treatment as planned.

## 2012-07-26 NOTE — Progress Notes (Signed)
Pt verbalized no c/o today.

## 2012-07-27 ENCOUNTER — Ambulatory Visit
Admission: RE | Admit: 2012-07-27 | Discharge: 2012-07-27 | Disposition: A | Discharge status: Home / Self Care | Payer: Managed Care, Other (non HMO) | Source: Ambulatory Visit | Attending: Radiation Oncology | Admitting: Radiation Oncology

## 2012-07-28 ENCOUNTER — Ambulatory Visit
Admission: RE | Admit: 2012-07-28 | Discharge: 2012-07-28 | Disposition: A | Discharge status: Home / Self Care | Payer: Managed Care, Other (non HMO) | Source: Ambulatory Visit | Attending: Radiation Oncology | Admitting: Radiation Oncology

## 2012-07-29 ENCOUNTER — Ambulatory Visit
Admission: RE | Admit: 2012-07-29 | Discharge: 2012-07-29 | Disposition: A | Discharge status: Home / Self Care | Payer: Managed Care, Other (non HMO) | Source: Ambulatory Visit | Attending: Radiation Oncology | Admitting: Radiation Oncology

## 2012-07-29 ENCOUNTER — Telehealth: Payer: Self-pay | Admitting: Radiation Oncology

## 2012-07-29 NOTE — Telephone Encounter (Signed)
INDIGENT DENIED FAMILY SIZE: 2 HH INC: [phone] MOD POV: 31,020.00 VALID DATES: 07/06/2012-01/03/2013  OVER QUALIFIED

## 2012-08-01 ENCOUNTER — Ambulatory Visit
Admission: RE | Admit: 2012-08-01 | Discharge: 2012-08-01 | Disposition: A | Discharge status: Home / Self Care | Payer: Managed Care, Other (non HMO) | Source: Ambulatory Visit | Attending: Radiation Oncology | Admitting: Radiation Oncology

## 2012-08-02 ENCOUNTER — Ambulatory Visit
Admission: RE | Admit: 2012-08-02 | Discharge: 2012-08-02 | Disposition: A | Discharge status: Home / Self Care | Payer: Managed Care, Other (non HMO) | Source: Ambulatory Visit | Attending: Radiation Oncology | Admitting: Radiation Oncology

## 2012-08-02 ENCOUNTER — Telehealth: Payer: Self-pay | Admitting: Radiation Oncology

## 2012-08-02 ENCOUNTER — Encounter: Payer: Self-pay | Admitting: Radiation Oncology

## 2012-08-02 VITALS — BP 134/88 | HR 90 | Temp 98.0°F | Resp 20 | Wt 119.5 lb

## 2012-08-02 DIAGNOSIS — C310 Malignant neoplasm of maxillary sinus: Secondary | ICD-10-CM

## 2012-08-02 NOTE — Progress Notes (Signed)
Pt denies pain, fatigue, loss of appetite. She states she has not taken any pain med, has not picked it up from drug store. Pt applying Biafine to right neck for hyperpigmentation.

## 2012-08-02 NOTE — Progress Notes (Signed)
Weekly Management Note Current Dose: 39.6  Gy  Projected Dose: 59.4 Gy   Narrative:  The patient presents for routine under treatment assessment.  CBCT/MVCT images/Port film x-rays were reviewed.  The chart was checked. Doing well. No dysphagia. Not taking any meds. Fatigue continues.   Physical Findings: Weight: 119 lb 8 oz (54.205 kg). Unchanged. No skin darkness or mucocitis.   Impression:  The patient is tolerating radiation.  Plan:  Continue treatment as planned.

## 2012-08-02 NOTE — Telephone Encounter (Signed)
Brandy Ramos a rep w [employer] dedicated team representing [employer] called to request clinicals for medical review, due to Dr. Luciano Cutter practice ([facility]) being out of network  at this time.   I fax over clinicals to their medical reviewer physician today.  They are saying, in order for this patient to be reviewed and possibly able to receive in-network rates through [employer], they have to review her eligibility status, based on her clinical conditions and needs.    Fx: [phone] Ph: [phone]

## 2012-08-03 ENCOUNTER — Ambulatory Visit
Admission: RE | Admit: 2012-08-03 | Discharge: 2012-08-03 | Disposition: A | Discharge status: Home / Self Care | Payer: Managed Care, Other (non HMO) | Source: Ambulatory Visit | Attending: Radiation Oncology | Admitting: Radiation Oncology

## 2012-08-03 ENCOUNTER — Telehealth: Payer: Self-pay | Admitting: Nutrition

## 2012-08-03 NOTE — Telephone Encounter (Signed)
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 I have called her back. Patient has canceled her nutrition appointment that was scheduled. I've asked her to be specific about what kinds of information she would like and I will mail fact sheets to her. I'm unable to provide sample menus at this time as I have not assessed her. Patient was given phone number for return call if desired.

## 2012-08-04 ENCOUNTER — Ambulatory Visit
Admission: RE | Admit: 2012-08-04 | Discharge: 2012-08-04 | Disposition: A | Discharge status: Home / Self Care | Payer: Managed Care, Other (non HMO) | Source: Ambulatory Visit | Attending: Radiation Oncology | Admitting: Radiation Oncology

## 2012-08-05 ENCOUNTER — Ambulatory Visit
Admission: RE | Admit: 2012-08-05 | Discharge: 2012-08-05 | Disposition: A | Discharge status: Home / Self Care | Payer: Managed Care, Other (non HMO) | Source: Ambulatory Visit | Attending: Radiation Oncology | Admitting: Radiation Oncology

## 2012-08-08 ENCOUNTER — Ambulatory Visit
Admission: RE | Admit: 2012-08-08 | Discharge: 2012-08-08 | Disposition: A | Discharge status: Home / Self Care | Payer: Managed Care, Other (non HMO) | Source: Ambulatory Visit | Attending: Radiation Oncology | Admitting: Radiation Oncology

## 2012-08-09 ENCOUNTER — Encounter: Payer: Self-pay | Admitting: Radiation Oncology

## 2012-08-09 ENCOUNTER — Ambulatory Visit
Admission: RE | Admit: 2012-08-09 | Discharge: 2012-08-09 | Disposition: A | Discharge status: Home / Self Care | Payer: Managed Care, Other (non HMO) | Source: Ambulatory Visit | Attending: Radiation Oncology | Admitting: Radiation Oncology

## 2012-08-09 VITALS — BP 139/84 | HR 78 | Temp 97.8°F | Resp 20 | Wt 120.9 lb

## 2012-08-09 DIAGNOSIS — C310 Malignant neoplasm of maxillary sinus: Secondary | ICD-10-CM

## 2012-08-09 NOTE — Progress Notes (Signed)
Weekly Management Note Current Dose:  48.6 Gy  Projected Dose: 59.4 Gy   Narrative:  The patient presents for routine under treatment assessment.  CBCT/MVCT images/Port film x-rays were reviewed.  The chart was checked. Some skin darkening on her right neck. Some dysphagia but not bad enough to take medications.  Physical Findings: Weight: 120 lb 14.4 oz (54.84 kg). Dark right neck. No visible mucositis.   Impression:  The patient is tolerating radiation.  Plan:  Continue treatment as planned. Continue biafene.

## 2012-08-09 NOTE — Progress Notes (Signed)
Pt denies pain, loss of appetite, difficulty eating. She is applying Biafine to right neck tx area. She states the skin "stings a little", is fatigued.

## 2012-08-10 ENCOUNTER — Ambulatory Visit
Admission: RE | Admit: 2012-08-10 | Discharge: 2012-08-10 | Disposition: A | Discharge status: Home / Self Care | Payer: Managed Care, Other (non HMO) | Source: Ambulatory Visit | Attending: Radiation Oncology | Admitting: Radiation Oncology

## 2012-08-11 ENCOUNTER — Ambulatory Visit: Payer: Managed Care, Other (non HMO)

## 2012-08-12 ENCOUNTER — Ambulatory Visit
Admission: RE | Admit: 2012-08-12 | Discharge: 2012-08-12 | Disposition: A | Discharge status: Home / Self Care | Payer: Managed Care, Other (non HMO) | Source: Ambulatory Visit | Attending: Radiation Oncology | Admitting: Radiation Oncology

## 2012-08-15 ENCOUNTER — Ambulatory Visit
Admission: RE | Admit: 2012-08-15 | Discharge: 2012-08-15 | Disposition: A | Discharge status: Home / Self Care | Payer: Managed Care, Other (non HMO) | Source: Ambulatory Visit | Attending: Radiation Oncology | Admitting: Radiation Oncology

## 2012-08-15 DIAGNOSIS — C310 Malignant neoplasm of maxillary sinus: Secondary | ICD-10-CM

## 2012-08-15 MED ORDER — BIAFINE EX EMUL
Freq: Two times a day (BID) | CUTANEOUS | Status: DC
  Administered 2012-08-15: 15:00:00 via TOPICAL

## 2012-08-16 ENCOUNTER — Ambulatory Visit
Admission: RE | Admit: 2012-08-16 | Discharge: 2012-08-16 | Disposition: A | Discharge status: Home / Self Care | Payer: Managed Care, Other (non HMO) | Source: Ambulatory Visit | Attending: Radiation Oncology | Admitting: Radiation Oncology

## 2012-08-16 ENCOUNTER — Encounter: Payer: Self-pay | Admitting: Radiation Oncology

## 2012-08-16 ENCOUNTER — Encounter: Payer: Self-pay | Admitting: Dietician

## 2012-08-16 VITALS — BP 136/93 | HR 81 | Temp 97.7°F | Resp 20

## 2012-08-16 DIAGNOSIS — C310 Malignant neoplasm of maxillary sinus: Secondary | ICD-10-CM

## 2012-08-16 NOTE — Progress Notes (Signed)
Weekly Management Note Current Dose:  55.8 Gy  Projected Dose: 59.4 Gy   Narrative:  The patient presents for routine under treatment assessment.  CBCT/MVCT images/Port film x-rays were reviewed.  The chart was checked. Doing well. No dysphagia. Some fatigue. Using biafene. Doing neck stretches and working on her trismus.  Physical Findings: Dry desquamation over right neck. No mucocitis.   Impression:  The patient is tolerating radiation.  Plan:  Continue treatment as planned. Continue biafene x 2 weeks then lotion with vit e. Call if pain worsens. Call [facility] for appt.

## 2012-08-16 NOTE — Progress Notes (Signed)
Breast Cancer Nutrition Class Attendance Note  Date: 08/16/2012  Pt attended [facility]'s Breast Cancer Nutrition Class, "Food For Your Fight". Pt was educated on basic cancer nutrition principles, including plant based diet and principles from [employer]  [employer] for [employer]) about latest nutrition findings and recommendations. Questions answered. Handouts and recipes provided.   Melody Haver, RD, LDN Pager: [phone]

## 2012-08-16 NOTE — Progress Notes (Signed)
Pt denies pain, difficulty swallowing, fatigue, loss of appetite. She is applying Biafine lotion to neck for dry desquamation.

## 2012-08-17 ENCOUNTER — Ambulatory Visit
Admission: RE | Admit: 2012-08-17 | Discharge: 2012-08-17 | Disposition: A | Discharge status: Home / Self Care | Payer: Managed Care, Other (non HMO) | Source: Ambulatory Visit | Attending: Radiation Oncology | Admitting: Radiation Oncology

## 2012-08-18 ENCOUNTER — Encounter: Payer: Self-pay | Admitting: Radiation Oncology

## 2012-08-18 ENCOUNTER — Ambulatory Visit: Payer: Managed Care, Other (non HMO)

## 2012-08-18 ENCOUNTER — Telehealth: Payer: Self-pay | Admitting: Radiation Oncology

## 2012-08-18 ENCOUNTER — Ambulatory Visit
Admission: RE | Admit: 2012-08-18 | Discharge: 2012-08-18 | Disposition: A | Discharge status: Home / Self Care | Payer: Managed Care, Other (non HMO) | Source: Ambulatory Visit | Attending: Radiation Oncology | Admitting: Radiation Oncology

## 2012-08-18 NOTE — Telephone Encounter (Signed)
Pt's dau (Devin) called to have clinicals faxed to [employer] dedicated team for continuation of care under in-networks rates for her mother, due to Dr. Luciano Cutter practice [facility] is out of network w [employer] at this time.  [employer] faxed to: Hel[place] [place] @ [phone]    Clinicals requested:  2010 - Current

## 2012-08-19 ENCOUNTER — Ambulatory Visit: Payer: Managed Care, Other (non HMO)

## 2012-08-22 ENCOUNTER — Telehealth: Payer: Self-pay | Admitting: *Deleted

## 2012-08-22 NOTE — Telephone Encounter (Signed)
Returned call from pt who states she is having difficulty drinking liquids due to thick phlegm.pt completed radiation to left neck on 08/18/12. She is using Biotene. She states she "feels like this is normal but wanted to be sure". Advised pt she may experience thickened saliva for a few weeks but should notice gradual improvement w/in the next week. Enc pt to call back if this worsens or she does not begin to have improvement next week. Pt verbalized understanding.

## 2012-08-28 NOTE — Progress Notes (Signed)
  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   She had the expected sore throat and mucocitis which was minimal.   Plan: The patient has completed radiation treatment. The patient will return to radiation oncology clinic for routine followup in one month. I advised them to call or return sooner if they have any questions or concerns related to their recovery or treatment.  ------------------------------------------------  Lurline Hare, MD

## 2012-09-19 ENCOUNTER — Encounter (HOSPITAL_COMMUNITY): Payer: Self-pay | Admitting: Dentistry

## 2012-09-19 ENCOUNTER — Ambulatory Visit (HOSPITAL_COMMUNITY): Payer: 59 | Admitting: Dentistry

## 2012-09-19 VITALS — BP 145/86 | HR 73 | Temp 97.4°F

## 2012-09-19 DIAGNOSIS — C801 Malignant (primary) neoplasm, unspecified: Secondary | ICD-10-CM

## 2012-09-19 DIAGNOSIS — C310 Malignant neoplasm of maxillary sinus: Secondary | ICD-10-CM

## 2012-09-19 DIAGNOSIS — Z09 Encounter for follow-up examination after completed treatment for conditions other than malignant neoplasm: Secondary | ICD-10-CM

## 2012-09-19 DIAGNOSIS — R252 Cramp and spasm: Secondary | ICD-10-CM

## 2012-09-19 DIAGNOSIS — K08109 Complete loss of teeth, unspecified cause, unspecified class: Secondary | ICD-10-CM

## 2012-09-19 DIAGNOSIS — Z0189 Encounter for other specified special examinations: Secondary | ICD-10-CM

## 2012-09-19 DIAGNOSIS — R432 Parageusia: Secondary | ICD-10-CM

## 2012-09-19 NOTE — Progress Notes (Signed)
09/19/2012  Patient:            Brandy Ramos Date of Birth:  Apr 15, 1951 MRN:                [id number]  BP 145/86  Pulse 73  Temp(Src) 97.4 F (36.3 C) (Oral)  [name] presents for periodic oral examination after radiation therapy. 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 The patient had right radical neck dissection on 05/23/2012 with Dr. Hezzie Bump that was followed with postoperative radiation therapy with Dr. [name]. The patient completed her radiation therapy on 08/18/12.   REVIEW OF CHIEF COMPLAINTS: DRY MOUTH: Yes.  HARD TO SWALLOW: No  HURT TO SWALLOW: No TASTE CHANGES: Taste is coming back slowly. SORES IN MOUTH: No TRISMUS: Maximum interincisal opening at 28 mm. Trismus better than before. Patient will consider referral to physical therapy at this time. WEIGHT: 119 pounds  HOME OH REGIMEN:  BRUSHING: After meals and at bedtime FLOSSING: 1 times a day RINSING: Using Biotene rinses. She is also using hydrogen peroxide and water in a 1: 5 dilution as needed FLUORIDE: Using fluoride at bedtime in trays with no problems TRISMUS EXERCISES:  Maximum interincisal opening: 28 mm as before. Patient doing exercises daily.   DENTAL EXAM:  Oral Hygiene:(PLAQUE): Very good oral hygiene LOCATION OF MUCOSITIS: None noted DESCRIPTION OF SALIVA: Incipient xerostomia ANY EXPOSED BONE: Maxillary right surgical defect OTHER WATCHED AREAS: Maxillary right surgical defect and retained root segment #5 DX:  1. Incipient xerostomia 2. 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 Dysgeusia-resolving 4. Retained root segment #5 with previous root canal therapy 5. Maxillary right surgical defect with need for evaluation for permanent obturator fabrication 6. History of 5 years of close Fosamax with potential risk for osteonecrosis of the jaw related to previous bisphosphonate therapy 7.  History of radiation therapy with risk for osteoradionecrosis with future extraction of teeth in the primary field radiation therapy.  RECOMMENDATIONS: 1. Brush after meals and at bedtime.  Use fluoride at bedtime. 2. Use trismus exercises as directed.  Patient will consider referral to a physical therapist at this time. 3. Use Biotene Rinse or salt water/baking soda rinses. 4. Multiple sips of water as needed. 5. We discussed referral to an oral and maxillofacial prosthodontist to evaluate her comprehensive dental needs and fabrication of an obturator.  We discussed the need to determine the ability to restore tooth #5 at this time with a possible coping versus dental extraction. We discussed future extraction of retained root segments #5 with an oral surgeon with or without hyperbaric oxygen therapy. Patient is at risk for osteoradionecrosis from radiation therapy as well as osteonecrosis of the jaw related to 5 years her previous Fosamax (bisphosphonate) therapy.  We also discussed possible need for referral to [facility] for management of her comprehensive dental treatment needs. Patient is aware that no dental prosthesis should be fabricated earlier than 11/15/2012. Patient is to call and let us know if she wishes referral to prosthodontist by the end of the week.   Call if problems before then.  Charlynne Pander, DDS

## 2012-09-19 NOTE — Patient Instructions (Signed)

## 2012-09-28 ENCOUNTER — Encounter (HOSPITAL_COMMUNITY): Payer: Self-pay | Admitting: Dentistry

## 2012-09-29 ENCOUNTER — Encounter (HOSPITAL_COMMUNITY): Payer: Self-pay | Admitting: Dentistry

## 2012-10-27 ENCOUNTER — Other Ambulatory Visit: Payer: Self-pay | Admitting: Internal Medicine

## 2012-12-22 ENCOUNTER — Encounter: Payer: Self-pay | Admitting: Specialist

## 2012-12-22 ENCOUNTER — Encounter: Payer: Self-pay | Admitting: Radiation Oncology

## 2012-12-22 ENCOUNTER — Ambulatory Visit
Admission: RE | Admit: 2012-12-22 | Discharge: 2012-12-22 | Disposition: A | Discharge status: Home / Self Care | Payer: Managed Care, Other (non HMO) | Source: Ambulatory Visit | Attending: Radiation Oncology | Admitting: Radiation Oncology

## 2012-12-22 VITALS — BP 138/78 | HR 90 | Temp 98.7°F | Ht 64.0 in | Wt 112.6 lb

## 2012-12-22 DIAGNOSIS — C310 Malignant neoplasm of maxillary sinus: Secondary | ICD-10-CM

## 2012-12-22 NOTE — Progress Notes (Signed)
Brandy Ramos is 4 months post radiation therapy to the right neck and Lymph nodes.   She denies any odonyphagia, but does have some dysphagia to breads and other thick foods if they are not moistened.  She reports dry mouth, but this does not interfere with eating.  Her mouth is clear.

## 2012-12-22 NOTE — Progress Notes (Signed)
I talked this afternoon briefly with Brandy Ramos, who I know from her participation in support programs, such as Finding Your New Normal.  She told me about several recent developments in her health, and she indicated she was quite worried about what tests might show.  I listened, supported her, asked her to contact me if I could be of help in supporting her further.  I gave her my card with my contact information.

## 2012-12-23 NOTE — Progress Notes (Signed)
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 She then had recurrence in the right submandibular lymph nodes and had a lymph node dissection which revealed 2/20 lymph nodes positive for metastatic disease. The tumor was adenocarcinoma and upon review was felt to be morphologically identical to her previous salivary gland tumor.  Summary of radiation: 66 gray to the right retromolar trigone and operative bed completed 07/17/2009 followed by 59.4 gray in 33 fractions completed 08/18/2012 into the right neck and draining lymph nodes  Interval History: Brandy Ramos presents today for routine followup.  She unfortunately saw Dr. Gordy Levan and earlier this month and had a CT scan performed at [facility]. This showed a enlarging lymph node in the right retropharyngeal space. He had no prior imaging and therefore asked me to review her prior imaging here. There certainly was not any retropharyngeal adenopathy on her CT of the neck last fall. The lymph node measured 2.1 x 10 mm other nodes were not enlarged in the bilateral jugular chains. A CT of the chest at the same time was negative for metastatic disease. She is absolutely asymptomatic from this mass. She is able to swallow normally. She has not noticed any difficulties in swallowing. She's been having some allergy-type symptoms. She is on a juice cleanse and therefore having trouble maintaining her weight. She states that she ate regular foods she would be maintaining her weight right about where it should be.  Allergies: No Known Allergies  Medications:  Current Outpatient  Prescriptions  Medication Sig Dispense Refill  . Calcium Carbonate-Vitamin D (CALCIUM 600/VITAMIN D) 600-400 MG-UNIT per chew tablet Chew 1 tablet by mouth daily.      . hydrochlorothiazide (HYDRODIURIL) 25 MG tablet Take 1 tablet (25 mg total) by mouth daily.  90 tablet  6  . LORazepam (ATIVAN) 0.5 MG tablet Take 1 tablet (0.5 mg total) by mouth 2 (two) times daily as needed. anxiety  60 tablet  3  . Multiple Vitamins-Minerals (MULTIVITAMIN PO) Take 1 tablet by mouth daily.      . sodium fluoride (FLUORISHIELD) 1.1 % GEL dental gel Brush and floss. Instill one drop of fluoride into tooth space of fluoride tray. Place over teeth for 5 minutes. Remove. Spit out excess. Repeat nightly.  120 mL  prn   No current facility-administered medications for this encounter.    Physical Exam:  Filed Vitals:   12/22/12 1629  BP: 138/78  Pulse: 90  Temp: 98.7 F (37.1 C)   she is a pleasant female in no distress sitting comfortably examining table. She has no palpable submandibular cervical or supra-clavicular lymph nodes. She has trismus which is stable. Examination of the oral cavity and oropharynx reveals no evidence of lesions.  IMPRESSION: Brandy Ramos is a 62 y.o. female status post resection of a retromolar trigone tumor with adjuvant radiation for close margin followed by right neck dissection for a nodal recurrence followed by adjuvant radiation now with a right retropharyngeal node recurrence 4 years out from her original surgery and 5 months from the end of radiation in February.  PLAN:  I spoke with [place] today. She  discussed a retropharyngeal node dissection with Dr. Gordy Levan in. She is reluctant to undergo surgery. I reviewed her plan and this area actually received in the neighborhood of 35-40 gait from her previous maxillary treatments. I did not extend to her recent neck treatment to the base of skull due to overlap with her previously radiated fields. This area it been technically has not been  targeted. I reviewed her planning scan as well and there may be a hint as something in the retroperitoneal space but no definitive lymph nodes and certainly not something that is 2 cm as was seen on a recent CT scan. I feel we could target this area with reradiation although the proximity next of the carotid artery as well as the fibrosis she could experience with treatment of the retro-pharynx could be significant. I also wonder if we should treat the left retropharynx and left neck as it seems that this disease keeps popping up outside the treated fields. It is interesting that such a low-grade nonaggressive tumor is behaving in such a clearly aggressive manner however. I do not feel radiation alone would be effective in controlling this gross disease. She would at least need chemotherapy and we should perhaps and alternative therapies such as fractionated radiosurgery or again surgical dissection followed by adjuvant radiation. All this of course is very distressing to her as she was hoping that this last round radiation with her last period she has a family trip planned to go [place] in August. She understands that she may be undergoing treatment to cancel letter. I think she probably needs a staging PET scan which would give [place] some confidence that this is malignant. There does seem to be a necrotic Center on the images Dr. Hezzie Bump sent over. Unfortunately this area is very difficult to biopsy so we may have to treat without pathologic confirmation of malignancy. She really doesn't have symptoms of an abscess however some not really sure what else this could be besides malignancy. I like to discuss the case with some of my colleagues and she is asked to reach out to the ENT at [facility] and she saw prior to her radiation 6 months ago. I will get by with her on Friday with the plan after talking to Dr. Hezzie Bump.    Lurline Hare, MD

## 2012-12-26 ENCOUNTER — Telehealth: Payer: Self-pay | Admitting: *Deleted

## 2012-12-26 NOTE — Telephone Encounter (Signed)
Called patient to inform of test, no answer, mailed appt. cards

## 2012-12-29 ENCOUNTER — Encounter: Payer: Self-pay | Admitting: Radiation Oncology

## 2013-01-04 ENCOUNTER — Telehealth: Payer: Self-pay | Admitting: *Deleted

## 2013-01-04 ENCOUNTER — Encounter (HOSPITAL_COMMUNITY): Admission: RE | Admit: 2013-01-04 | Payer: Managed Care, Other (non HMO) | Source: Ambulatory Visit

## 2013-01-04 NOTE — Telephone Encounter (Signed)
Called patient to let her know that her test has been rescheduled, lvm for a return call

## 2013-01-05 ENCOUNTER — Ambulatory Visit: Payer: Managed Care, Other (non HMO) | Admitting: Radiation Oncology

## 2013-01-05 ENCOUNTER — Ambulatory Visit
Admission: RE | Admit: 2013-01-05 | Payer: Managed Care, Other (non HMO) | Source: Ambulatory Visit | Admitting: Radiation Oncology

## 2013-01-05 HISTORY — DX: Personal history of irradiation: Z92.3

## 2013-01-12 ENCOUNTER — Encounter (HOSPITAL_COMMUNITY)
Admission: RE | Admit: 2013-01-12 | Discharge: 2013-01-12 | Disposition: A | Discharge status: Home / Self Care | Payer: Managed Care, Other (non HMO) | Source: Ambulatory Visit | Attending: Radiation Oncology | Admitting: Radiation Oncology

## 2013-01-12 ENCOUNTER — Telehealth: Payer: Self-pay | Admitting: Radiation Oncology

## 2013-01-12 ENCOUNTER — Encounter: Payer: Self-pay | Admitting: Radiation Oncology

## 2013-01-12 ENCOUNTER — Telehealth: Payer: Self-pay | Admitting: *Deleted

## 2013-01-12 ENCOUNTER — Ambulatory Visit
Admission: RE | Admit: 2013-01-12 | Discharge: 2013-01-12 | Disposition: A | Discharge status: Home / Self Care | Payer: Managed Care, Other (non HMO) | Source: Ambulatory Visit | Attending: Radiation Oncology | Admitting: Radiation Oncology

## 2013-01-12 VITALS — BP 153/90 | HR 88 | Temp 98.1°F | Resp 20 | Wt 115.1 lb

## 2013-01-12 DIAGNOSIS — C310 Malignant neoplasm of maxillary sinus: Secondary | ICD-10-CM

## 2013-01-12 DIAGNOSIS — N83209 Unspecified ovarian cyst, unspecified side: Secondary | ICD-10-CM | POA: Insufficient documentation

## 2013-01-12 MED ORDER — FLUDEOXYGLUCOSE F - 18 (FDG) INJECTION
17.7000 | Freq: Once | INTRAVENOUS | Status: AC | PRN
  Administered 2013-01-12: 17.7 via INTRAVENOUS

## 2013-01-12 NOTE — Telephone Encounter (Signed)
Faxed PET to Dr. Johny Drilling per SW.  Called to have images pushed to [facility] via PACs.  Also faxed Dr. Luciano Cutter notes and PET report to medical records.  Received confirmation for both.

## 2013-01-12 NOTE — Telephone Encounter (Signed)
Called patient to inform of appt. With Dr. Austin Miles on 01-17-13- at 10 am, spoke with patient and she is aware of this appt.

## 2013-01-12 NOTE — Telephone Encounter (Signed)
xxxx 

## 2013-01-12 NOTE — Progress Notes (Signed)
Follow up  S/p radiation txs right neck and draining lymph nodes 07/04/12-08/18/12 Had Pet scan today,pending , had apple pie without any difficulty today, drinks water, juices, using dental shield/fluroide , jaw mouth exercises, no nause, nodifficulty chewing or swallowing, appetite good, no c/o pain  12:07 PM

## 2013-01-12 NOTE — Telephone Encounter (Signed)
Called radiology dept# [phone] if pet scan results were ready,patient had done at 9am today, and her f/u appt is at noon with Dr.Wentworth,  was transferred to [phone], per female stated "It's in the slot,I'll ask them to read it and can fax to your dept,, thanked her and will check next 1/2 hour if available in Epic 11:15 AM  11:15 AM

## 2013-01-12 NOTE — Progress Notes (Signed)
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 This was followed by 66 gray at 2 gray per fraction x33 fractions to the right retromolar trigone which she completed in January of 2011 she underwent a right neck dissection on 05/23/2012 for a recurrent adenocarcinoma in her right submandibular gland. 2/20 lymph nodes were positive. The pathology was reviewed and was felt to be similar to her previous cancer. She then completed 59.4 gray in 33 fractions on 08/18/2012 to the right neck and submandibular region.   Interval History: [place] presents today for routine followup.  She was undergoing followup with Dr. Hezzie Bump and was found on CT to have a enlarging right retropharyngeal node. This measured 2.1 x 1.0 cm. A CT of the chest was negative for metastatic disease. He is asymptomatic from this mass. It has not been biopsied. She underwent a PET scan today which shows uptake in just this retropharyngeal node. Despite its small size the SUV was 10.2. No other adenopathy was noted. No evidence of metastatic disease. She is still asymptomatic from this mass and is accompanied by her daughter today. I discussed her case with my colleagues as well as with Dr. Hezzie Bump.   Allergies: No Known Allergies  Medications:  Current Outpatient Prescriptions  Medication Sig Dispense Refill  . Calcium Carbonate-Vitamin D (CALCIUM 600/VITAMIN D) 600-400 MG-UNIT per chew tablet Chew 1 tablet by mouth daily.      . hydrochlorothiazide (HYDRODIURIL) 25 MG tablet  Take 1 tablet (25 mg total) by mouth daily.  90 tablet  6  . LORazepam (ATIVAN) 0.5 MG tablet Take 1 tablet (0.5 mg total) by mouth 2 (two) times daily as needed. anxiety  60 tablet  3  . Multiple Vitamins-Minerals (MULTIVITAMIN PO) Take 1 tablet by mouth daily.      . sodium fluoride (FLUORISHIELD) 1.1 % GEL dental gel Brush and floss. Instill one drop of fluoride into tooth space of fluoride tray. Place over teeth for 5 minutes. Remove. Spit out excess. Repeat nightly.  120 mL  prn   No current facility-administered medications for this encounter.    Physical Exam:  Filed Vitals:   01/12/13 1203  BP: 153/90  Pulse: 88  Temp: 98.1 F (36.7 C)  Resp: 20   she is a pleasant female in no distress sitting comfortably examining table. She has slight trismus. She has some very faint dark skin on her right side consistent with her prior radiotherapy.  IMPRESSION: Effie is a 62 y.o. female with what appears to be a retropharyngeal node recurrence of her adenocarcinoma  PLAN:  I spoke with [place] and her daughter today about her diagnosis and options for treatment. Clearly this tumor is behaving aggressively despite looking very low-grade microscopically. I do not believe that fractionated radiation or even radiation plus radiosensitizing chemotherapy will provide adequate local control with an intact adenocarcinoma. For this reason I discussed surgery followed by postop radiation with her which I believe would be the standard treatment. That being said of course the reported cases in the literature of this are very few.  Another option would be fractionated radiosurgery. Perhaps we can increase the radio biologic effective dose of radiation by increasing the dose per fraction. By performing this in a stereotactic head frame a smaller margin may be used to avoid dose to the carotid artery which is in close proximity to this region. There are cases of stereotactic radiosurgery being used in this area  for patients with recurrent nasopharyngeal cancer. I talked to her about both of these options and have referred her to Dr. Austin Miles at [facility] for consideration of fractionated radiosurgery and encouraged her to followup with Dr. Hezzie Bump to discuss retropharyngeal node dissection. Her daughter was interested in information regarding holistic treatments or natural therapies. We discussed the role of the liver and kidneys are maintaining homeostasis. I told her I did not feel any alternative therapies would be a good curative option for her.    Lurline Hare, MD

## 2013-01-16 ENCOUNTER — Telehealth: Payer: Self-pay | Admitting: *Deleted

## 2013-01-16 NOTE — Telephone Encounter (Signed)
Faxed notes to [facility] (Dr. Everardo [facility] Office) on this patient, confrimation received where notes went through.

## 2013-01-18 ENCOUNTER — Other Ambulatory Visit: Payer: Self-pay | Admitting: Obstetrics and Gynecology

## 2013-01-27 DIAGNOSIS — Z923 Personal history of irradiation: Secondary | ICD-10-CM

## 2013-01-27 HISTORY — DX: Personal history of irradiation: Z92.3

## 2013-04-24 ENCOUNTER — Ambulatory Visit (INDEPENDENT_AMBULATORY_CARE_PROVIDER_SITE_OTHER): Payer: Managed Care, Other (non HMO) | Admitting: Internal Medicine

## 2013-04-24 ENCOUNTER — Encounter: Payer: Self-pay | Admitting: Internal Medicine

## 2013-04-24 VITALS — BP 130/80 | HR 81 | Temp 97.9°F | Resp 20 | Ht 63.5 in | Wt 112.0 lb

## 2013-04-24 DIAGNOSIS — I10 Essential (primary) hypertension: Secondary | ICD-10-CM

## 2013-04-24 DIAGNOSIS — Z0000 Encounter for general adult medical examination without abnormal findings: Secondary | ICD-10-CM

## 2013-04-24 DIAGNOSIS — Z23 Encounter for immunization: Secondary | ICD-10-CM

## 2013-04-24 LAB — CBC WITH DIFFERENTIAL/PLATELET
Basophils Relative: 0.1 % (ref 0.0–3.0)
Eosinophils Absolute: 0.1 10*3/uL (ref 0.0–0.7)
Eosinophils Relative: 1.7 % (ref 0.0–5.0)
Hemoglobin: 12.7 g/dL (ref 12.0–15.0)
MCHC: 31.9 g/dL (ref 30.0–36.0)
MCV: 71.3 fl — ABNORMAL LOW (ref 78.0–100.0)
Monocytes Absolute: 0.6 10*3/uL (ref 0.1–1.0)
Neutro Abs: 3.7 10*3/uL (ref 1.4–7.7)
RBC: 5.58 Mil/uL — ABNORMAL HIGH (ref 3.87–5.11)
RDW: 15.2 % — ABNORMAL HIGH (ref 11.5–14.6)
WBC: 4.8 10*3/uL (ref 4.5–10.5)

## 2013-04-24 LAB — COMPREHENSIVE METABOLIC PANEL
ALT: 87 U/L — ABNORMAL HIGH (ref 0–35)
AST: 45 U/L — ABNORMAL HIGH (ref 0–37)
Albumin: 3.9 g/dL (ref 3.5–5.2)
Alkaline Phosphatase: 61 U/L (ref 39–117)
BUN: 12 mg/dL (ref 6–23)
Creatinine, Ser: 0.8 mg/dL (ref 0.4–1.2)
Potassium: 4 mEq/L (ref 3.5–5.1)
Sodium: 140 mEq/L (ref 135–145)
Total Bilirubin: 0.6 mg/dL (ref 0.3–1.2)

## 2013-04-24 LAB — LIPID PANEL
Cholesterol: 225 mg/dL — ABNORMAL HIGH (ref 0–200)
HDL: 84.3 mg/dL (ref >39.00)
Triglycerides: 51 mg/dL (ref 0.0–149.0)
VLDL: 10.2 mg/dL (ref 0.0–40.0)

## 2013-04-24 LAB — TSH: TSH: 1.44 u[IU]/mL (ref 0.35–5.50)

## 2013-04-24 MED ORDER — HYDROCHLOROTHIAZIDE 25 MG PO TABS: 25.0000 mg | ORAL_TABLET | Freq: Every day | ORAL | Status: DC

## 2013-04-24 MED ORDER — LORAZEPAM 0.5 MG PO TABS: 0.5000 mg | ORAL_TABLET | Freq: Two times a day (BID) | ORAL | Status: DC | PRN

## 2013-04-24 NOTE — Progress Notes (Signed)
Subjective:    Patient ID: Brandy Ramos, female    DOB: 12/05/1950, 62 y.o.   MRN: [id number]  HPI 30 -year-old patient who is seen today for followup. She is seen for a preventive health examination. She is followed by Dr. Tenny Craw and length and has had a recent gynecologic evaluation.  Her last colonoscopy was  probably 2004. No new concerns or complaints. 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  The patient had right radical neck dissection on 05/23/2012 with Dr. Hezzie Bump that was followed with postoperative radiation therapy with Dr. [name].  The patient completed her radiation therapy on 08/18/12.    Past Medical History  Diagnosis Date  . Anxiety state, unspecified 06/16/2007  . DEPRESSION 11/04/2007  . HYPERTENSION 12/11/2008  . INSOMNIA 06/16/2007  . 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 OSTEOPOROSIS 11/04/2007    S/P 5 years of Fosamax therapy  . Palpitations 07/11/2008  . SHOULDER PAIN, RIGHT 05/29/2008  . 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 S/P radiation > 12 weeks 05/30/2009-07/17/09    completed radiation therapy January 2011  . S/P radiation therapy greater than twelve weeks ago 07/04/12-08/18/12    r neck& draining lymph nodes,59.4Gy/365fx    History   Social History  . Marital Status: Married    Spouse Name: N/A    Number of Children: N/A  . Years of Education: N/A   Occupational History  . Not on file.   Social History Main Topics  . Smoking status: Never Smoker   . Smokeless tobacco: Never Used  . Alcohol Use: Yes     Comment: very rare glass of wine, occ'l beer  . Drug Use: No  . Sexual Activity: Not on file   Other Topics Concern  . Not on file   Social History Narrative   The patient is married and has 3 children.   The patient is a nonsmoker.  Patient has never used smokeless tobacco products.   The patient with rare use of alcohol, drinking wine or beer.   The patient's mother is alive at age is 74 with a history of heart disease, multiple stents, and hypertension.   The father passed away at the age of 49 from Colon Cancer    Past Surgical History  Procedure Laterality Date  . Cesarean section    . Skin graft  2010    right maxillectomy  . Right partial maxillectomy  02/2009  . Right selective neck dissection  05/23/2012    Family History  Problem Relation Age of Onset  . Heart disease Mother   . Hypertension Mother   . Cancer Father     No Known Allergies  Current Outpatient Prescriptions on File Prior to Visit  Medication Sig Dispense Refill  . Calcium Carbonate-Vitamin D (CALCIUM 600/VITAMIN D) 600-400 MG-UNIT per chew tablet Chew 1 tablet by mouth daily.      . Multiple Vitamins-Minerals (MULTIVITAMIN PO) Take 1 tablet by mouth daily.      . sodium fluoride (FLUORISHIELD) 1.1 % GEL dental gel Brush and floss. Instill one drop of fluoride into tooth space of fluoride tray. Place over teeth for 5 minutes. Remove. Spit out excess. Repeat nightly.  120 mL  prn   No current facility-administered medications  on file prior to visit.    BP 130/80  Pulse 81  Temp(Src) 97.9 F (36.6 C) (Oral)  Resp 20  Ht 5' 3.5" (1.613 m)  Wt 112 lb (50.803 kg)  BMI 19.53 kg/m2  SpO2 97%       Review of Systems  Constitutional: Negative for fever, appetite change, fatigue and unexpected weight change.  HENT: Negative for congestion, dental problem, ear pain, hearing loss, mouth sores, nosebleeds, sinus pressure, sore throat, tinnitus, trouble swallowing and voice change.   Eyes: Negative for photophobia, pain, redness and visual disturbance.  Respiratory: Negative for cough, chest tightness and shortness of breath.   Cardiovascular: Negative for  chest pain, palpitations and leg swelling.  Gastrointestinal: Negative for nausea, vomiting, abdominal pain, diarrhea, constipation, blood in stool, abdominal distention and rectal pain.  Genitourinary: Negative for dysuria, urgency, frequency, hematuria, flank pain, vaginal bleeding, vaginal discharge, difficulty urinating, genital sores, vaginal pain, menstrual problem and pelvic pain.  Musculoskeletal: Negative for arthralgias, back pain and neck stiffness.  Skin: Negative for rash.  Neurological: Negative for dizziness, syncope, speech difficulty, weakness, light-headedness, numbness and headaches.  Hematological: Negative for adenopathy. Does not bruise/bleed easily.  Psychiatric/Behavioral: Negative for suicidal ideas, behavioral problems, self-injury, dysphoric mood and agitation. The patient is not nervous/anxious.        Objective:   Physical Exam  Constitutional: She is oriented to person, place, and time. She appears well-developed and well-nourished.  HENT:  Head: Normocephalic and atraumatic.  Right Ear: External ear normal.  Left Ear: External ear normal.  Mouth/Throat: Oropharynx is clear and moist.  Large oval defect in the right hard palate area  Eyes: Conjunctivae and EOM are normal.  Neck: Normal range of motion. Neck supple. No JVD present. No thyromegaly present.  Cardiovascular: Normal rate, regular rhythm, normal heart sounds and intact distal pulses.   No murmur heard. Pulmonary/Chest: Effort normal and breath sounds normal. She has no wheezes. She has no rales.  Abdominal: Soft. Bowel sounds are normal. She exhibits no distension and no mass. There is no tenderness. There is no rebound and no guarding.  Musculoskeletal: Normal range of motion. She exhibits no edema and no tenderness.  Neurological: She is alert and oriented to person, place, and time. She has normal reflexes. No cranial nerve deficit. She exhibits normal muscle tone. Coordination (hard palate area)  normal.  Skin: Skin is warm and dry. No rash noted.  Psychiatric: She has a normal mood and affect. Her behavior is normal.          Assessment & Plan:   Preventive health examination Hypertension controlled History right maxillary sinus cancer  history of anxiety depression stable  We'll continue present regimen calcium and vitamin D supplements recommended. We'll follow up with gynecology and ENT at [facility]. Return here in one year. Follow up surveillance colonoscopy

## 2013-04-24 NOTE — Patient Instructions (Signed)
Limit your sodium (Salt) intake    It is important that you exercise regularly, at least 20 minutes 3 to 4 times per week.  If you develop chest pain or shortness of breath seek  medical attention.  Take a calcium supplement, plus [phone] units of vitamin D  Schedule your colonoscopy to help detect colon cancer.  Return in one year for follow-up

## 2013-05-05 ENCOUNTER — Ambulatory Visit: Payer: Managed Care, Other (non HMO) | Admitting: Radiation Oncology

## 2013-05-08 ENCOUNTER — Encounter: Payer: Self-pay | Admitting: *Deleted

## 2013-05-09 ENCOUNTER — Ambulatory Visit
Admission: RE | Admit: 2013-05-09 | Discharge: 2013-05-09 | Disposition: A | Discharge status: Home / Self Care | Payer: Managed Care, Other (non HMO) | Source: Ambulatory Visit | Attending: Radiation Oncology | Admitting: Radiation Oncology

## 2013-05-09 VITALS — BP 139/90 | HR 73 | Temp 98.2°F | Wt 113.1 lb

## 2013-05-09 DIAGNOSIS — C310 Malignant neoplasm of maxillary sinus: Secondary | ICD-10-CM

## 2013-05-09 NOTE — Progress Notes (Addendum)
   Department of Radiation Oncology  Phone:  [phone] Fax:        [phone]   Name: Brandy Ramos MRN: [id number]  DOB: [birth year]  Date: 05/09/2013  Follow Up Visit Note  Diagnosis: Retropharyngeal lymph node recurrence from a low grade adenocarcinoma originally present in the right retromolar trigone treated with surgery on 03/22/2009 revealing a 2.0 cm polymorphous low-grade adenocarcinoma with a close superior margin tumor size 2.0 cm no perineural invasion lymphatic or bone invasion. This was followed by 66 gray at 2 gray per fraction x33 fractions to the right retromolar trigone which she completed in January of 2011 she underwent a right neck dissection on 05/23/2012 for a recurrent adenocarcinoma in her right submandibular gland. 2/20 lymph nodes were positive. The pathology was reviewed and was felt to be similar to her previous cancer. She then completed 59.4 gray in 33 fractions on 08/18/2012 to the right neck and submandibular region. Follow up imaging showed a 2.1 cm right retropharyngeal node.  Given her previous radiation, she was referred to [facility] and received fractionated radiosurgery 32 Gy in 4 fractions August 2014  Interval History: She has done well since her GammaKnife procedure. She has no difficulty swallowing, no swelling, no elarging lymph nodes.  She had an MRI of the brain on 10/21 which showed a decrease in size of the node. She has follow up with imaging scheduled in February there.  She has some dry mouth which she treats with biotene.    Allergies: No Known Allergies  Medications:  Current Outpatient Prescriptions  Medication Sig Dispense Refill  . Calcium Carbonate-Vitamin D (CALCIUM 600/VITAMIN D) 600-400 MG-UNIT per chew tablet Chew 1 tablet by mouth daily.      . hydrochlorothiazide (HYDRODIURIL) 25 MG tablet Take 1 tablet (25 mg total) by mouth daily.  90 tablet  3  . LORazepam (ATIVAN) 0.5 MG tablet Take 1 tablet (0.5 mg total) by mouth 2  (two) times daily as needed. anxiety  60 tablet  3  . Multiple Vitamins-Minerals (MULTIVITAMIN PO) Take 1 tablet by mouth daily.      Marland Kitchen omeprazole (PRILOSEC) 40 MG capsule Take 40 mg by mouth daily.      . sodium fluoride (FLUORISHIELD) 1.1 % GEL dental gel Brush and floss. Instill one drop of fluoride into tooth space of fluoride tray. Place over teeth for 5 minutes. Remove. Spit out excess. Repeat nightly.  120 mL  prn   No current facility-administered medications for this encounter.    Physical Exam:  Filed Vitals:   05/09/13 0922  BP: 139/90  Pulse: 73  Temp: 98.2 F (36.8 C)   she is a pleasant female in no distress sitting comfortably examining table. She has slight trismus. She has some very faint dark skin on her right side consistent with her prior radiotherapy.  IMPRESSION AND PLAN: Brandy Ramos is a 62 y.o. female who has currently no evidence of disease although remains at risk given her tumors propensity for local recurrence.  She has follow up with Dr. Johny Drilling in February.  I will see her this summer.  She knows she can call with any questions or concerns in the interim.     Lurline Hare, MD

## 2013-05-09 NOTE — Progress Notes (Signed)
Patient here for routine follow up assessment.Doing well.Has some acid reflux which she has started taking prilosec.Continued dry mouth.States she had scan on 04/18/13 which was ok.Will call [facility] for results.

## 2013-05-15 ENCOUNTER — Telehealth: Payer: Self-pay | Admitting: *Deleted

## 2013-05-15 DIAGNOSIS — R748 Abnormal levels of other serum enzymes: Secondary | ICD-10-CM

## 2013-05-15 NOTE — Telephone Encounter (Signed)
Left message on voicemail to call office.  

## 2013-05-16 NOTE — Telephone Encounter (Signed)
Pt has been trying to reach you. Can yo call her on [phone]. This is a work number that she will answer.

## 2013-05-16 NOTE — Telephone Encounter (Signed)
Left message at work number and home to call office.

## 2013-05-19 ENCOUNTER — Other Ambulatory Visit: Payer: Self-pay | Admitting: Internal Medicine

## 2013-05-19 NOTE — Telephone Encounter (Signed)
Pt stopped by office told her liver enzymes mildly elevated and need to repeat in 3 months per Dr.Kwiatkowski. Pt verbalized understanding. Order done.

## 2013-06-01 ENCOUNTER — Encounter: Payer: Self-pay | Admitting: Internal Medicine

## 2013-06-05 ENCOUNTER — Encounter: Payer: Self-pay | Admitting: Internal Medicine

## 2013-06-09 ENCOUNTER — Encounter: Payer: Self-pay | Admitting: Gastroenterology

## 2013-07-19 ENCOUNTER — Other Ambulatory Visit (INDEPENDENT_AMBULATORY_CARE_PROVIDER_SITE_OTHER): Payer: Managed Care, Other (non HMO)

## 2013-07-19 DIAGNOSIS — R748 Abnormal levels of other serum enzymes: Secondary | ICD-10-CM

## 2013-07-19 LAB — HEPATIC FUNCTION PANEL
ALK PHOS: 59 U/L (ref 39–117)
ALT: 13 U/L (ref 0–35)
AST: 16 U/L (ref 0–37)
Albumin: 3.8 g/dL (ref 3.5–5.2)
BILIRUBIN DIRECT: 0 mg/dL (ref 0.0–0.3)
BILIRUBIN TOTAL: 0.5 mg/dL (ref 0.3–1.2)
TOTAL PROTEIN: 7.4 g/dL (ref 6.0–8.3)

## 2013-08-03 ENCOUNTER — Encounter: Payer: Self-pay | Admitting: Gastroenterology

## 2013-08-09 ENCOUNTER — Encounter: Payer: Self-pay | Admitting: Internal Medicine

## 2013-08-21 ENCOUNTER — Other Ambulatory Visit: Payer: Self-pay

## 2014-01-31 LAB — HM PAP SMEAR

## 2014-03-22 ENCOUNTER — Encounter: Payer: Self-pay | Admitting: Radiation Oncology

## 2014-03-22 NOTE — Progress Notes (Signed)
Spoke with patient about current LOA paperwork that she faxed to [place], she stated that she is currently being treated by a Dr. Vallarie Mare at [facility].  I explained that she would need to have them complete her paperwork.

## 2014-04-10 ENCOUNTER — Emergency Department (HOSPITAL_COMMUNITY)
Admission: EM | Admit: 2014-04-10 | Discharge: 2014-04-10 | Disposition: A | Discharge status: Home / Self Care | Payer: Managed Care, Other (non HMO) | Source: Home / Self Care | Attending: Emergency Medicine | Admitting: Emergency Medicine

## 2014-04-10 ENCOUNTER — Encounter (HOSPITAL_COMMUNITY): Payer: Self-pay | Admitting: Emergency Medicine

## 2014-04-10 DIAGNOSIS — N390 Urinary tract infection, site not specified: Secondary | ICD-10-CM

## 2014-04-10 LAB — POCT URINALYSIS DIP (DEVICE)
Bilirubin Urine: NEGATIVE
Glucose, UA: NEGATIVE mg/dL
KETONES UR: NEGATIVE mg/dL
Nitrite: POSITIVE — AB
PH: 6 (ref 5.0–8.0)
PROTEIN: 100 mg/dL — AB
Specific Gravity, Urine: 1.025 (ref 1.005–1.030)
Urobilinogen, UA: 0.2 mg/dL (ref 0.0–1.0)

## 2014-04-10 MED ORDER — LORAZEPAM 0.5 MG PO TABS: 0.5000 mg | ORAL_TABLET | Freq: Two times a day (BID) | ORAL | Status: DC | PRN

## 2014-04-10 MED ORDER — CEPHALEXIN 500 MG PO CAPS: 500.0000 mg | ORAL_CAPSULE | Freq: Three times a day (TID) | ORAL | Status: DC

## 2014-04-10 NOTE — ED Provider Notes (Signed)
CSN: [id number]     Arrival date & time 04/10/14  1834 History   None    Chief Complaint  Patient presents with  . Urinary Frequency   (Consider location/radiation/quality/duration/timing/severity/associated sxs/prior Treatment) HPI She is a 63 year old woman here for evaluation of urinary frequency. She states her symptoms started about 4 days ago with urinary frequency and urgency. She had a UTI several years in the past, and since this felt similar, she picked up some "UTI pills" at the drug store. Today she noted some hematuria, and sent her symptoms have not resolved decided to be evaluated. She denies fevers, chills, flank pain, vomiting, abdominal pain. Denies frank dysuria.  She also requests a refill of her Ativan. She states she takes half a pill as needed to help her rest. She's been using this since a jaw surgery back in 2010. She states was last refilled about a year ago by her primary care provider.  Past Medical History  Diagnosis Date  . Anxiety state, unspecified 06/16/2007  . DEPRESSION 11/04/2007  . HYPERTENSION 12/11/2008  . INSOMNIA 06/16/2007  . 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 OSTEOPOROSIS 11/04/2007    S/P 5 years of Fosamax therapy  . Palpitations 07/11/2008  . SHOULDER PAIN, RIGHT 05/29/2008  . 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 S/P radiation > 12 weeks 05/30/2009-07/17/09    completed radiation therapy January 2011  . S/P radiation therapy greater than twelve weeks ago 07/04/12-08/18/12    r neck& draining lymph nodes,59.4Gy/364fx   Past Surgical History  Procedure Laterality Date  . Cesarean section    . Skin graft  2010    right maxillectomy  . Right partial maxillectomy  02/2009  . Right selective neck dissection  05/23/2012   Family History  Problem Relation Age of Onset  . Heart disease Mother   . Hypertension Mother   . Cancer  Father    History  Substance Use Topics  . Smoking status: Never Smoker   . Smokeless tobacco: Never Used  . Alcohol Use: Yes     Comment: very rare glass of wine, occ'l beer   OB History   Grav Para Term Preterm Abortions TAB SAB Ect Mult Living                 Review of Systems  Constitutional: Negative.  Negative for fever.  Gastrointestinal: Negative for vomiting and abdominal pain.  Genitourinary: Positive for urgency, frequency and hematuria. Negative for dysuria and flank pain.    Allergies  Review of patient's allergies indicates no known allergies.  Home Medications   Prior to Admission medications   Medication Sig Start Date End Date Taking? Authorizing Provider  hydrochlorothiazide (HYDRODIURIL) 25 MG tablet Take 1 tablet (25 mg total) by mouth daily. 04/24/13  Yes Marletta Lor, MD  LORazepam (ATIVAN) 0.5 MG tablet Take 1 tablet (0.5 mg total) by mouth 2 (two) times daily as needed. anxiety 04/24/13  Yes Marletta Lor, MD  Calcium Carbonate-Vitamin D (CALCIUM 600/VITAMIN D) 600-400 MG-UNIT per chew tablet Chew 1 tablet by mouth daily.    Historical Provider, MD  cephALEXin (KEFLEX) 500 MG capsule Take 1 capsule (500 mg total) by mouth 3 (three) times daily. 04/10/14   Melony Overly, MD  LORazepam (ATIVAN) 0.5 MG tablet Take 1 tablet (0.5 mg total) by mouth 2 (two) times daily as needed for anxiety. 04/10/14  Melony Overly, MD  Multiple Vitamins-Minerals (MULTIVITAMIN PO) Take 1 tablet by mouth daily.    Historical Provider, MD  omeprazole (PRILOSEC) 40 MG capsule Take 40 mg by mouth daily.    Historical Provider, MD   BP 138/80  Pulse 87  Temp(Src) 98.9 F (37.2 C) (Oral)  Resp 16  SpO2 97% Physical Exam  Constitutional: She is oriented to person, place, and time. She appears well-developed and well-nourished. No distress.  Cardiovascular: Normal rate.   Pulmonary/Chest: Effort normal.  Abdominal: Soft. She exhibits no distension. There is no  tenderness. There is no rebound and no guarding.  Neurological: She is alert and oriented to person, place, and time.  Skin: Skin is warm and dry.    ED Course  Procedures (including critical care time) Labs Review Labs Reviewed  POCT URINALYSIS DIP (DEVICE) - Abnormal; Notable for the following:    Hgb urine dipstick LARGE (*)    Protein, ur 100 (*)    Nitrite POSITIVE (*)    Leukocytes, UA MODERATE (*)    All other components within normal limits  URINE CULTURE    Imaging Review No results found.   MDM   1. UTI (lower urinary tract infection)    UA is consistent with urinary infection. Will treat with Keflex and send urine for culture. Warning signs reviewed as in after visit summary Followup as needed  I've provided a prescription for Ativan 0.5 mg tablets, #15. She has an appointment coming up with her primary care provider, at which point she will get her typical prescription.    Melony Overly, MD 04/10/14 [phone]

## 2014-04-10 NOTE — Discharge Instructions (Signed)
You have a bladder infection. Take Keflex 1 pill 3 times a day for 3 days. We will call you if we need to change antibiotics based on your culture results. If you develop fevers, vomiting, or flank pain, please come back.

## 2014-04-10 NOTE — ED Notes (Signed)
Pt states that she feels urinary urgency and frequency. Pt denies pt at this time but does states she has mild discomfort. No acute distress noted

## 2014-04-13 LAB — URINE CULTURE: Colony Count: >=100000

## 2014-04-13 NOTE — ED Notes (Signed)
Urine culture: >100,000 colonies Klebsiella Pneumoniae.  Pt. adequately treated with Keflex. Roselyn Meier 04/13/2014

## 2014-04-30 ENCOUNTER — Telehealth: Payer: Self-pay | Admitting: Internal Medicine

## 2014-04-30 NOTE — Telephone Encounter (Signed)
Patient Information:  Caller Name: Adana  Phone: [phone]  Patient: Brandy Ramos, Brandy Ramos  Gender: Female  DOB: 17-Apr-1951  Age: 63 Years  PCP: Bluford Kaufmann (Family Practice > 55yrs old)  Office Follow Up:  Does the office need to follow up with this patient?: Yes  Instructions For The Office: Disposition: Discuss with MD and call back within 1 hour.   Symptoms  Reason For Call & Symptoms: 04/30/14 1430 BP 188/116 right arm, 190/102 left arm, pt states she has not taken her HCTZ the past two days.  Denies any other sxs.   Pt states she is leaving work to go home and will take HCTZ and then recheck BP.   Per disposition - advised pt a message would also be sent to Dr Burnice Logan and she would receive a call back.  Reviewed Health History In EMR: Yes  Reviewed Medications In EMR: Yes  Reviewed Allergies In EMR: Yes  Reviewed Surgeries / Procedures: Yes  Date of Onset of Symptoms: 04/30/2014  Guideline(s) Used:  High Blood Pressure  Disposition Per Guideline:   Discuss with PCP and Callback by Nurse within 1 Hour  Reason For Disposition Reached:   BP = 180/110 and missed most recent dose of blood pressure medication  Advice Given:  Call Back If:  Headache, blurred vision, difficulty talking, or difficulty walking occurs  Chest pain or difficulty breathing occurs  You become worse.  Patient Will Follow Care Advice:  YES

## 2014-04-30 NOTE — Telephone Encounter (Signed)
Dr. K, please see message. 

## 2014-04-30 NOTE — Telephone Encounter (Signed)
Left message on voicemail to call office. Tried to call pt to see what her blood pressure is after taking medication.

## 2014-04-30 NOTE — Telephone Encounter (Signed)
Spoke to pt, told her to make sure she takes her medication everyday and I will call in the morning to see how her blood pressure is. Pt verbalized understanding and stated it has come down last reading 120/70?Marland Kitchen Told her okay.

## 2014-04-30 NOTE — Telephone Encounter (Signed)
Pt called back and left voicemail, that since she took her blood pressure medication blood pressure has come down Left arm 140/89 and Right arm 143/94. Please advise.

## 2014-05-01 NOTE — Telephone Encounter (Signed)
Left message on voicemail to call office on home and cell.

## 2014-05-02 NOTE — Telephone Encounter (Signed)
Left message on voicemail to call office on home and cell.

## 2014-05-02 NOTE — Telephone Encounter (Signed)
Pt called back and left message on my voicemail that blood pressure is better has come down and she is taking medication everyday.

## 2014-05-02 NOTE — Telephone Encounter (Signed)
Left detailed message on pt's cell phone that I got message that blood pressure is better, continue to monitor and if elevated 150/90 to call office.

## 2014-05-16 ENCOUNTER — Other Ambulatory Visit: Payer: Self-pay | Admitting: Internal Medicine

## 2014-08-13 ENCOUNTER — Telehealth: Payer: Self-pay | Admitting: *Deleted

## 2014-08-13 NOTE — Telephone Encounter (Signed)
Patient called asking if Dr. Pablo Ledger would give her clearance to have some fillings removed and possible  Chipped tooth also , she stated she needs to have clearance for her dentist, informed her Dr. Pablo Ledger is out of office today will in basket her and tomorrow will call her an update, patient thanked this RN and will await call back tomorrow, 3:34 PM

## 2014-08-14 ENCOUNTER — Other Ambulatory Visit: Payer: Self-pay | Admitting: Internal Medicine

## 2014-08-15 NOTE — Telephone Encounter (Signed)
Sounds ok, I think. I need to talk to the dentist about which teeth he needs to address.  Please have dentist call me!

## 2014-10-22 ENCOUNTER — Other Ambulatory Visit (INDEPENDENT_AMBULATORY_CARE_PROVIDER_SITE_OTHER): Payer: Managed Care, Other (non HMO)

## 2014-10-22 DIAGNOSIS — Z0000 Encounter for general adult medical examination without abnormal findings: Secondary | ICD-10-CM | POA: Diagnosis not present

## 2014-10-22 LAB — HEPATIC FUNCTION PANEL
ALBUMIN: 3.9 g/dL (ref 3.5–5.2)
ALT: 16 U/L (ref 0–35)
AST: 19 U/L (ref 0–37)
Alkaline Phosphatase: 59 U/L (ref 39–117)
BILIRUBIN DIRECT: 0.1 mg/dL (ref 0.0–0.3)
Total Bilirubin: 0.4 mg/dL (ref 0.2–1.2)
Total Protein: 7 g/dL (ref 6.0–8.3)

## 2014-10-22 LAB — POCT URINALYSIS DIPSTICK
Bilirubin, UA: NEGATIVE
Glucose, UA: NEGATIVE
KETONES UA: NEGATIVE
Leukocytes, UA: NEGATIVE
NITRITE UA: NEGATIVE
Protein, UA: NEGATIVE
Spec Grav, UA: 1.015
UROBILINOGEN UA: 0.2
pH, UA: 7

## 2014-10-22 LAB — CBC WITH DIFFERENTIAL/PLATELET
BASOS ABS: 0 10*3/uL (ref 0.0–0.1)
BASOS PCT: 0.1 % (ref 0.0–3.0)
EOS ABS: 0.1 10*3/uL (ref 0.0–0.7)
Eosinophils Relative: 2.2 % (ref 0.0–5.0)
HEMATOCRIT: 40.9 % (ref 36.0–46.0)
HEMOGLOBIN: 13.2 g/dL (ref 12.0–15.0)
LYMPHS ABS: 0.6 10*3/uL — AB (ref 0.7–4.0)
Lymphocytes Relative: 14.9 % (ref 12.0–46.0)
MCHC: 32.2 g/dL (ref 30.0–36.0)
MCV: 71.1 fl — ABNORMAL LOW (ref 78.0–100.0)
MONOS PCT: 8.2 % (ref 3.0–12.0)
Monocytes Absolute: 0.4 10*3/uL (ref 0.1–1.0)
NEUTROS ABS: 3.2 10*3/uL (ref 1.4–7.7)
NEUTROS PCT: 74.6 % (ref 43.0–77.0)
Platelets: 234 10*3/uL (ref 150.0–400.0)
RBC: 5.76 Mil/uL — ABNORMAL HIGH (ref 3.87–5.11)
RDW: 15.4 % (ref 11.5–15.5)
WBC: 4.3 10*3/uL (ref 4.0–10.5)

## 2014-10-22 LAB — BASIC METABOLIC PANEL
BUN: 16 mg/dL (ref 6–23)
CO2: 31 meq/L (ref 19–32)
Calcium: 9.6 mg/dL (ref 8.4–10.5)
Chloride: 102 mEq/L (ref 96–112)
Creatinine, Ser: 0.82 mg/dL (ref 0.40–1.20)
GFR: 90.01 mL/min (ref >60.00)
GLUCOSE: 85 mg/dL (ref 70–99)
Potassium: 4.1 mEq/L (ref 3.5–5.1)
Sodium: 137 mEq/L (ref 135–145)

## 2014-10-22 LAB — LIPID PANEL
CHOL/HDL RATIO: 3
CHOLESTEROL: 219 mg/dL — AB (ref 0–200)
HDL: 75.8 mg/dL (ref >39.00)
LDL Cholesterol: 136 mg/dL — ABNORMAL HIGH (ref 0–99)
NonHDL: 143.2
TRIGLYCERIDES: 37 mg/dL (ref 0.0–149.0)
VLDL: 7.4 mg/dL (ref 0.0–40.0)

## 2014-10-22 LAB — TSH: TSH: 2.06 u[IU]/mL (ref 0.35–4.50)

## 2014-10-29 ENCOUNTER — Ambulatory Visit (INDEPENDENT_AMBULATORY_CARE_PROVIDER_SITE_OTHER): Payer: Managed Care, Other (non HMO) | Admitting: Internal Medicine

## 2014-10-29 ENCOUNTER — Encounter: Payer: Self-pay | Admitting: Internal Medicine

## 2014-10-29 ENCOUNTER — Other Ambulatory Visit: Payer: Self-pay | Admitting: *Deleted

## 2014-10-29 VITALS — BP 102/70 | HR 90 | Temp 98.4°F | Resp 18 | Ht 63.5 in | Wt 107.0 lb

## 2014-10-29 DIAGNOSIS — I10 Essential (primary) hypertension: Secondary | ICD-10-CM

## 2014-10-29 DIAGNOSIS — Z0000 Encounter for general adult medical examination without abnormal findings: Secondary | ICD-10-CM | POA: Diagnosis not present

## 2014-10-29 DIAGNOSIS — M810 Age-related osteoporosis without current pathological fracture: Secondary | ICD-10-CM

## 2014-10-29 DIAGNOSIS — C310 Malignant neoplasm of maxillary sinus: Secondary | ICD-10-CM

## 2014-10-29 MED ORDER — LORAZEPAM 0.5 MG PO TABS: 0.5000 mg | ORAL_TABLET | Freq: Two times a day (BID) | ORAL | Status: DC | PRN

## 2014-10-29 MED ORDER — HYDROCHLOROTHIAZIDE 25 MG PO TABS: ORAL_TABLET | ORAL | Status: DC

## 2014-10-29 NOTE — Progress Notes (Signed)
Subjective:    Patient ID: Brandy Ramos, female    DOB: 05/19/1951, 64 y.o.   MRN: [id number]  HPI 72  -year-old patient who is seen today for a preventive health examination. She is followed by Dr. Harrington Challenger  and has had a recent gynecologic evaluation.  Her last colonoscopy was  probably 2004. No new concerns or complaints. 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  The patient had right radical neck dissection on 05/23/2012 with Dr. Nicolette Bang that was followed with postoperative radiation therapy with Dr. Pablo Ledger.  The patient completed her radiation therapy on 08/18/12.  She remains quite active and goes to a health club 4 times per week;  cares for an elderly mother  Patient had follow-up colonoscopy 2014  Wt Readings from Last 3 Encounters:  10/29/14 107 lb (48.535 kg)  05/09/13 113 lb 1.6 oz (51.302 kg)  04/24/13 112 lb (50.803 kg)    Past Medical History  Diagnosis Date  . Anxiety state, unspecified 06/16/2007  . DEPRESSION 11/04/2007  . HYPERTENSION 12/11/2008  . INSOMNIA 06/16/2007  . 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 OSTEOPOROSIS 11/04/2007    S/P 5 years of Fosamax therapy  . Palpitations 07/11/2008  . SHOULDER PAIN, RIGHT 05/29/2008  . 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 S/P radiation > 12 weeks 05/30/2009-07/17/09    completed radiation therapy January 2011  . S/P radiation therapy greater than twelve weeks ago 07/04/12-08/18/12    r neck& draining lymph nodes,59.4Gy/374fx    History   Social History  . Marital Status: Married    Spouse Name: N/A  . Number of Children: N/A  . Years of Education: N/A   Occupational History  . Not on file.   Social History Main Topics  . Smoking status:  Never Smoker   . Smokeless tobacco: Never Used  . Alcohol Use: Yes     Comment: very rare glass of wine, occ'l beer  . Drug Use: No  . Sexual Activity: Not on file   Other Topics Concern  . Not on file   Social History Narrative   The patient is married and has 3 children.   The patient is a nonsmoker.  Patient has never used smokeless tobacco products.   The patient with rare use of alcohol, drinking wine or beer.   The patient's mother is alive at age is 15 with a history of heart disease, multiple stents, and hypertension.   The father passed away at the age of 42 from Colon Cancer    Past Surgical History  Procedure Laterality Date  . Cesarean section    . Skin graft  2010    right maxillectomy  . Right partial maxillectomy  02/2009  . Right selective neck dissection  05/23/2012    Family History  Problem Relation Age of Onset  . Heart disease Mother   . Hypertension Mother   . Cancer Father     No Known Allergies  Current Outpatient Prescriptions on File Prior to Visit  Medication Sig Dispense Refill  . Calcium Carbonate-Vitamin D (CALCIUM 600/VITAMIN D) 600-400 MG-UNIT per chew tablet Chew 1 tablet by mouth daily.    . Multiple Vitamins-Minerals (MULTIVITAMIN PO) Take 1 tablet by mouth daily.  No current facility-administered medications on file prior to visit.    BP 102/70 mmHg  Pulse 90  Temp(Src) 98.4 F (36.9 C) (Oral)  Resp 18  Ht 5' 3.5" (1.613 m)  Wt 107 lb (48.535 kg)  BMI 18.65 kg/m2  SpO2 97%       Review of Systems  Constitutional: Negative for fever, appetite change, fatigue and unexpected weight change.  HENT: Negative for congestion, dental problem, ear pain, hearing loss, mouth sores, nosebleeds, sinus pressure, sore throat, tinnitus, trouble swallowing and voice change.   Eyes: Negative for photophobia, pain, redness and visual disturbance.  Respiratory: Negative for cough, chest tightness and shortness of breath.    Cardiovascular: Negative for chest pain, palpitations and leg swelling.  Gastrointestinal: Negative for nausea, vomiting, abdominal pain, diarrhea, constipation, blood in stool, abdominal distention and rectal pain.  Genitourinary: Negative for dysuria, urgency, frequency, hematuria, flank pain, vaginal bleeding, vaginal discharge, difficulty urinating, genital sores, vaginal pain, menstrual problem and pelvic pain.  Musculoskeletal: Negative for back pain, arthralgias and neck stiffness.  Skin: Negative for rash.  Neurological: Negative for dizziness, syncope, speech difficulty, weakness, light-headedness, numbness and headaches.  Hematological: Negative for adenopathy. Does not bruise/bleed easily.  Psychiatric/Behavioral: Negative for suicidal ideas, behavioral problems, self-injury, dysphoric mood and agitation. The patient is not nervous/anxious.        Objective:   Physical Exam  Constitutional: She is oriented to person, place, and time. She appears well-developed and well-nourished.  HENT:  Head: Normocephalic and atraumatic.  Right Ear: External ear normal.  Left Ear: External ear normal.  Mouth/Throat: Oropharynx is clear and moist.  Large oval defect in the right hard palate area  Eyes: Conjunctivae and EOM are normal.  Neck: Normal range of motion. Neck supple. No JVD present. No thyromegaly present.  Cardiovascular: Normal rate, regular rhythm, normal heart sounds and intact distal pulses.   No murmur heard. Pulmonary/Chest: Effort normal and breath sounds normal. She has no wheezes. She has no rales.  Abdominal: Soft. Bowel sounds are normal. She exhibits no distension and no mass. There is no tenderness. There is no rebound and no guarding.  Musculoskeletal: Normal range of motion. She exhibits no edema or tenderness.  Neurological: She is alert and oriented to person, place, and time. She has normal reflexes. No cranial nerve deficit. She exhibits normal muscle tone.  Coordination (hard palate area) normal.  Skin: Skin is warm and dry. No rash noted.  Psychiatric: She has a normal mood and affect. Her behavior is normal.          Assessment & Plan:   Preventive health examination Hypertension controlled History right maxillary sinus cancer  history of anxiety depression stable  We'll continue present regimen calcium and vitamin D supplements recommended. We'll follow up with gynecology and ENT at [facility]. Return here in one year. Follow up surveillance colonoscopy performed in 2014 We'll check a bone density in view of her history of osteoporosis.  Treated with biphosphonate's for greater than 5 years

## 2014-10-29 NOTE — Patient Instructions (Addendum)
Take a calcium supplement, plus 800-1200 units of vitamin D    It is important that you exercise regularly, at least 20 minutes 3 to 4 times per week.  If you develop chest pain or shortness of breath seek  medical attention.  Return in one year for follow-up Health Maintenance Adopting a healthy lifestyle and getting preventive care can go a long way to promote health and wellness. Talk with your health care provider about what schedule of regular examinations is right for you. This is a good chance for you to check in with your provider about disease prevention and staying healthy. In between checkups, there are plenty of things you can do on your own. Experts have done a lot of research about which lifestyle changes and preventive measures are most likely to keep you healthy. Ask your health care provider for more information. WEIGHT AND DIET  Eat a healthy diet  Be sure to include plenty of vegetables, fruits, low-fat dairy products, and lean protein.  Do not eat a lot of foods high in solid fats, added sugars, or salt.  Get regular exercise. This is one of the most important things you can do for your health.  Most adults should exercise for at least 150 minutes each week. The exercise should increase your heart rate and make you sweat (moderate-intensity exercise).  Most adults should also do strengthening exercises at least twice a week. This is in addition to the moderate-intensity exercise.  Maintain a healthy weight  Body mass index (BMI) is a measurement that can be used to identify possible weight problems. It estimates body fat based on height and weight. Your health care provider can help determine your BMI and help you achieve or maintain a healthy weight.  For females 20 years of age and older:   A BMI below 18.5 is considered underweight.  A BMI of 18.5 to 24.9 is normal.  A BMI of 25 to 29.9 is considered overweight.  A BMI of 30 and above is considered obese.   Watch levels of cholesterol and blood lipids  You should start having your blood tested for lipids and cholesterol at 65 years of age, then have this test every 5 years.  You may need to have your cholesterol levels checked more often if:  Your lipid or cholesterol levels are high.  You are older than 65 years of age.  You are at high risk for heart disease.  CANCER SCREENING   Lung Cancer  Lung cancer screening is recommended for adults 55-80 years old who are at high risk for lung cancer because of a history of smoking.  A yearly low-dose CT scan of the lungs is recommended for people who:  Currently smoke.  Have quit within the past 15 years.  Have at least a 30-pack-year history of smoking. A pack year is smoking an average of one pack of cigarettes a day for 1 year.  Yearly screening should continue until it has been 15 years since you quit.  Yearly screening should stop if you develop a health problem that would prevent you from having lung cancer treatment.  Breast Cancer  Practice breast self-awareness. This means understanding how your breasts normally appear and feel.  It also means doing regular breast self-exams. Let your health care provider know about any changes, no matter how small.  If you are in your 20s or 30s, you should have a clinical breast exam (CBE) by a health care provider every 1-3 years   as part of a regular health exam.  If you are 32 or older, have a CBE every year. Also consider having a breast X-ray (mammogram) every year.  If you have a family history of breast cancer, talk to your health care provider about genetic screening.  If you are at high risk for breast cancer, talk to your health care provider about having an MRI and a mammogram every year.  Breast cancer gene (BRCA) assessment is recommended for women who have family members with BRCA-related cancers. BRCA-related cancers  include:  Breast.  Ovarian.  Tubal.  Peritoneal cancers.  Results of the assessment will determine the need for genetic counseling and BRCA1 and BRCA2 testing. Cervical Cancer Routine pelvic examinations to screen for cervical cancer are no longer recommended for nonpregnant women who are considered low risk for cancer of the pelvic organs (ovaries, uterus, and vagina) and who do not have symptoms. A pelvic examination may be necessary if you have symptoms including those associated with pelvic infections. Ask your health care provider if a screening pelvic exam is right for you.   The Pap test is the screening test for cervical cancer for women who are considered at risk.  If you had a hysterectomy for a problem that was not cancer or a condition that could lead to cancer, then you no longer need Pap tests.  If you are older than 65 years, and you have had normal Pap tests for the past 10 years, you no longer need to have Pap tests.  If you have had past treatment for cervical cancer or a condition that could lead to cancer, you need Pap tests and screening for cancer for at least 20 years after your treatment.  If you no longer get a Pap test, assess your risk factors if they change (such as having a new sexual partner). This can affect whether you should start being screened again.  Some women have medical problems that increase their chance of getting cervical cancer. If this is the case for you, your health care provider may recommend more frequent screening and Pap tests.  The human papillomavirus (HPV) test is another test that may be used for cervical cancer screening. The HPV test looks for the virus that can cause cell changes in the cervix. The cells collected during the Pap test can be tested for HPV.  The HPV test can be used to screen women 28 years of age and older. Getting tested for HPV can extend the interval between normal Pap tests from three to five years.  An HPV  test also should be used to screen women of any age who have unclear Pap test results.  After 65 years of age, women should have HPV testing as often as Pap tests.  Colorectal Cancer  This type of cancer can be detected and often prevented.  Routine colorectal cancer screening usually begins at 65 years of age and continues through 65 years of age.  Your health care provider may recommend screening at an earlier age if you have risk factors for colon cancer.  Your health care provider may also recommend using home test kits to check for hidden blood in the stool.  A small camera at the end of a tube can be used to examine your colon directly (sigmoidoscopy or colonoscopy). This is done to check for the earliest forms of colorectal cancer.  Routine screening usually begins at age 60.  Direct examination of the colon should be repeated every  5-10 years through 65 years of age. However, you may need to be screened more often if early forms of precancerous polyps or small growths are found. Skin Cancer  Check your skin from head to toe regularly.  Tell your health care provider about any new moles or changes in moles, especially if there is a change in a mole's shape or color.  Also tell your health care provider if you have a mole that is larger than the size of a pencil eraser.  Always use sunscreen. Apply sunscreen liberally and repeatedly throughout the day.  Protect yourself by wearing long sleeves, pants, a wide-brimmed hat, and sunglasses whenever you are outside. HEART DISEASE, DIABETES, AND HIGH BLOOD PRESSURE   Have your blood pressure checked at least every 1-2 years. High blood pressure causes heart disease and increases the risk of stroke.  If you are between 2 years and 56 years old, ask your health care provider if you should take aspirin to prevent strokes.  Have regular diabetes screenings. This involves taking a blood sample to check your fasting blood sugar  level.  If you are at a normal weight and have a low risk for diabetes, have this test once every three years after 65 years of age.  If you are overweight and have a high risk for diabetes, consider being tested at a younger age or more often. PREVENTING INFECTION  Hepatitis B  If you have a higher risk for hepatitis B, you should be screened for this virus. You are considered at high risk for hepatitis B if:  You were born in a country where hepatitis B is common. Ask your health care provider which countries are considered high risk.  Your parents were born in a high-risk country, and you have not been immunized against hepatitis B (hepatitis B vaccine).  You have HIV or AIDS.  You use needles to inject street drugs.  You live with someone who has hepatitis B.  You have had sex with someone who has hepatitis B.  You get hemodialysis treatment.  You take certain medicines for conditions, including cancer, organ transplantation, and autoimmune conditions. Hepatitis C  Blood testing is recommended for:  Everyone born from [birth year] through 1966.  Anyone with known risk factors for hepatitis C. Sexually transmitted infections (STIs)  You should be screened for sexually transmitted infections (STIs) including gonorrhea and chlamydia if:  You are sexually active and are younger than 65 years of age.  You are older than 65 years of age and your health care provider tells you that you are at risk for this type of infection.  Your sexual activity has changed since you were last screened and you are at an increased risk for chlamydia or gonorrhea. Ask your health care provider if you are at risk.  If you do not have HIV, but are at risk, it may be recommended that you take a prescription medicine daily to prevent HIV infection. This is called pre-exposure prophylaxis (PrEP). You are considered at risk if:  You are sexually active and do not regularly use condoms or know the HIV status  of your partner(s).  You take drugs by injection.  You are sexually active with a partner who has HIV. Talk with your health care provider about whether you are at high risk of being infected with HIV. If you choose to begin PrEP, you should first be tested for HIV. You should then be tested every 3 months for as long as you  are taking PrEP.  PREGNANCY   If you are premenopausal and you may become pregnant, ask your health care provider about preconception counseling.  If you may become pregnant, take 400 to 800 micrograms (mcg) of folic acid every day.  If you want to prevent pregnancy, talk to your health care provider about birth control (contraception). OSTEOPOROSIS AND MENOPAUSE   Osteoporosis is a disease in which the bones lose minerals and strength with aging. This can result in serious bone fractures. Your risk for osteoporosis can be identified using a bone density scan.  If you are 74 years of age or older, or if you are at risk for osteoporosis and fractures, ask your health care provider if you should be screened.  Ask your health care provider whether you should take a calcium or vitamin D supplement to lower your risk for osteoporosis.  Menopause may have certain physical symptoms and risks.  Hormone replacement therapy may reduce some of these symptoms and risks. Talk to your health care provider about whether hormone replacement therapy is right for you.  HOME CARE INSTRUCTIONS   Schedule regular health, dental, and eye exams.  Stay current with your immunizations.   Do not use any tobacco products including cigarettes, chewing tobacco, or electronic cigarettes.  If you are pregnant, do not drink alcohol.  If you are breastfeeding, limit how much and how often you drink alcohol.  Limit alcohol intake to no more than 1 drink per day for nonpregnant women. One drink equals 12 ounces of beer, 5 ounces of wine, or 1 ounces of hard liquor.  Do not use street  drugs.  Do not share needles.  Ask your health care provider for help if you need support or information about quitting drugs.  Tell your health care provider if you often feel depressed.  Tell your health care provider if you have ever been abused or do not feel safe at home. Document Released: 12/29/2010 Document Revised: 10/30/2013 Document Reviewed: 05/17/2013 [facility] Patient Information 2015 [place], [place]. This information is not intended to replace advice given to you by your health care provider. Make sure you discuss any questions you have with your health care provider.

## 2014-10-29 NOTE — Progress Notes (Signed)
Pre visit review using our clinic review tool, if applicable. No additional management support is needed unless otherwise documented below in the visit note. 

## 2014-11-16 ENCOUNTER — Other Ambulatory Visit: Payer: Self-pay

## 2014-11-23 ENCOUNTER — Encounter: Payer: Self-pay | Admitting: Internal Medicine

## 2014-12-12 ENCOUNTER — Inpatient Hospital Stay: Admission: RE | Admit: 2014-12-12 | Payer: Self-pay | Source: Ambulatory Visit

## 2014-12-12 ENCOUNTER — Ambulatory Visit (INDEPENDENT_AMBULATORY_CARE_PROVIDER_SITE_OTHER)
Admission: RE | Admit: 2014-12-12 | Discharge: 2014-12-12 | Disposition: A | Discharge status: Home / Self Care | Payer: Managed Care, Other (non HMO) | Source: Ambulatory Visit | Attending: Internal Medicine | Admitting: Internal Medicine

## 2014-12-12 DIAGNOSIS — M810 Age-related osteoporosis without current pathological fracture: Secondary | ICD-10-CM

## 2014-12-12 DIAGNOSIS — Z0000 Encounter for general adult medical examination without abnormal findings: Secondary | ICD-10-CM

## 2015-02-19 LAB — HM MAMMOGRAPHY: HM Mammogram: NEGATIVE

## 2015-03-19 ENCOUNTER — Encounter: Payer: Self-pay | Admitting: Internal Medicine

## 2015-04-10 DIAGNOSIS — R718 Other abnormality of red blood cells: Secondary | ICD-10-CM | POA: Insufficient documentation

## 2015-06-11 ENCOUNTER — Other Ambulatory Visit: Payer: Self-pay | Admitting: Internal Medicine

## 2015-06-11 NOTE — Telephone Encounter (Signed)
Last fill was for #15 only.  Directions say take twice daily.  Okay to give #60 with a refill?

## 2015-06-11 NOTE — Telephone Encounter (Signed)
ok 

## 2015-08-06 ENCOUNTER — Other Ambulatory Visit: Payer: Self-pay | Admitting: Radiology

## 2015-08-06 DIAGNOSIS — C089 Malignant neoplasm of major salivary gland, unspecified: Secondary | ICD-10-CM

## 2015-08-14 ENCOUNTER — Ambulatory Visit
Admission: RE | Admit: 2015-08-14 | Discharge: 2015-08-14 | Disposition: A | Discharge status: Home / Self Care | Payer: Managed Care, Other (non HMO) | Source: Ambulatory Visit | Attending: Radiology | Admitting: Radiology

## 2015-08-14 DIAGNOSIS — C089 Malignant neoplasm of major salivary gland, unspecified: Secondary | ICD-10-CM

## 2015-08-14 MED ORDER — IOPAMIDOL (ISOVUE-300) INJECTION 61%
75.0000 mL | Freq: Once | INTRAVENOUS | Status: AC | PRN
  Administered 2015-08-14: 75 mL via INTRAVENOUS

## 2015-10-24 ENCOUNTER — Other Ambulatory Visit: Payer: Self-pay

## 2015-10-31 ENCOUNTER — Encounter: Payer: Self-pay | Admitting: Internal Medicine

## 2015-10-31 ENCOUNTER — Other Ambulatory Visit: Payer: Self-pay | Admitting: Internal Medicine

## 2015-11-22 ENCOUNTER — Other Ambulatory Visit: Payer: Self-pay | Admitting: Cardiology

## 2015-11-22 DIAGNOSIS — R079 Chest pain, unspecified: Secondary | ICD-10-CM

## 2015-11-27 ENCOUNTER — Encounter (HOSPITAL_COMMUNITY)
Admission: RE | Admit: 2015-11-27 | Discharge: 2015-11-27 | Disposition: A | Discharge status: Home / Self Care | Payer: Managed Care, Other (non HMO) | Source: Ambulatory Visit | Attending: Cardiology | Admitting: Cardiology

## 2015-11-27 ENCOUNTER — Ambulatory Visit (HOSPITAL_COMMUNITY)
Admission: RE | Admit: 2015-11-27 | Discharge: 2015-11-27 | Disposition: A | Discharge status: Home / Self Care | Payer: Managed Care, Other (non HMO) | Source: Ambulatory Visit | Attending: Cardiology | Admitting: Cardiology

## 2015-11-27 DIAGNOSIS — R079 Chest pain, unspecified: Secondary | ICD-10-CM | POA: Diagnosis not present

## 2015-11-27 MED ORDER — REGADENOSON 0.4 MG/5ML IV SOLN
0.4000 mg | Freq: Once | INTRAVENOUS | Status: AC
Start: 2015-11-27 — End: 2015-11-27
  Administered 2015-11-27: 0.4 mg via INTRAVENOUS

## 2015-11-27 MED ORDER — TECHNETIUM TC 99M TETROFOSMIN IV KIT
10.0000 | PACK | Freq: Once | INTRAVENOUS | Status: AC | PRN
  Administered 2015-11-27: 10 via INTRAVENOUS

## 2015-11-27 MED ORDER — TECHNETIUM TC 99M TETROFOSMIN IV KIT
30.0000 | PACK | Freq: Once | INTRAVENOUS | Status: AC | PRN
  Administered 2015-11-27: 30 via INTRAVENOUS

## 2015-11-27 MED ORDER — REGADENOSON 0.4 MG/5ML IV SOLN
INTRAVENOUS | Status: AC
  Filled 2015-11-27: qty 5

## 2015-11-27 NOTE — Progress Notes (Signed)
5 mins, Dr. Terrence Dupont at bedside. Pt denies chest tightness or any other associated symptoms.  Test ended

## 2015-11-27 NOTE — Progress Notes (Signed)
1 min, Dr. Terrence Dupont at bedside. Pt reports chest tightness and ears stopping up.

## 2015-11-27 NOTE — Progress Notes (Signed)
Pretest vs 

## 2015-11-27 NOTE — Progress Notes (Signed)
3 min, Dr. Terrence Dupont at bedside. Pt reports chest tightness is "feeling better."

## 2015-12-10 ENCOUNTER — Emergency Department (HOSPITAL_COMMUNITY)
Admission: EM | Admit: 2015-12-10 | Discharge: 2015-12-10 | Discharge status: Absent without leave | Payer: Managed Care, Other (non HMO)

## 2016-01-15 ENCOUNTER — Other Ambulatory Visit (HOSPITAL_COMMUNITY): Payer: Self-pay | Admitting: Radiology

## 2016-01-15 DIAGNOSIS — C089 Malignant neoplasm of major salivary gland, unspecified: Secondary | ICD-10-CM

## 2016-01-15 DIAGNOSIS — C770 Secondary and unspecified malignant neoplasm of lymph nodes of head, face and neck: Secondary | ICD-10-CM

## 2016-02-06 ENCOUNTER — Other Ambulatory Visit: Payer: Self-pay | Admitting: Internal Medicine

## 2016-02-29 ENCOUNTER — Other Ambulatory Visit: Payer: Self-pay | Admitting: Internal Medicine

## 2016-03-03 ENCOUNTER — Encounter: Payer: Self-pay | Admitting: Skilled Nursing Facility1

## 2016-03-03 ENCOUNTER — Encounter: Payer: Medicare Other | Attending: Internal Medicine | Admitting: Skilled Nursing Facility1

## 2016-03-03 DIAGNOSIS — R636 Underweight: Secondary | ICD-10-CM

## 2016-03-03 DIAGNOSIS — G309 Alzheimer's disease, unspecified: Secondary | ICD-10-CM | POA: Insufficient documentation

## 2016-03-03 DIAGNOSIS — Z713 Dietary counseling and surveillance: Secondary | ICD-10-CM | POA: Diagnosis not present

## 2016-03-03 DIAGNOSIS — F0280 Dementia in other diseases classified elsewhere without behavioral disturbance: Secondary | ICD-10-CM | POA: Insufficient documentation

## 2016-03-03 NOTE — Progress Notes (Signed)
  Medical Nutrition Therapy:  Appt start time: 1400 end time:  1500.   Assessment:  Primary concerns today: referred for underwt. Pt states her mother is having early stages of alzheimers which is causing her stress. PT states her blood pressure medicine makes her dizzy and nausous/GI upset. 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 Pt states every now and then she feels sluggish due to depression from worrying about her mother. PT states she does not realize she needs to eat until she feels nauseated and like a very empty stomach. Pt states she can tolerate food. Pt states when she is around her mother she forgets to eat. Pt states she had radiation 2014 which is when her wt was declining but pt states her appetite and eating were not affected. Pts usual wt is 115-120 pounds. Pt states her bowel movements are regular. Pt states she wakes up in the middle of the night feeling hungry but she does not eat. Upon bring the pt back for the appointment pt almost fell over due to lightheadedness.  Upon nutrition focused physical exam: pt has subcutaneous musculature and fat loss in the following areas: orbital, temple, clavical and acromion bone region, dorsal hand, with a distended lower abdomen.  [place]: Fat percent: 14.8% Fat mass: 15.4 pounds FFM: 88.6 pounds TBW: 59.2 Preferred Learning Style:   No preference indicated   Learning Readiness:   Ready  MEDICATIONS: See Lost   DIETARY INTAKE:  Usual eating pattern includes 1 meals and 1 snacks per day.  Everyday foods include none stated.  Avoided foods include none stated.   *Pt states she usually only has one two  Meals a day 24-hr recall:  B ( AM): scrambled egg with grits---none more often Snk ( AM):  L ( PM): subway sandwich---none more often Snk ( PM): nuts D ( PM): chipotle---green bens, meat, mashed potatoes Snk ( PM):  Beverages: banana, spinach, and avocado smoothie, water,  probiotic juice, celery beat juice  Usual physical activity: 3 days a week yoga, aerobics 3 days for 30 minutes, walking 2 miles 2 times a week  Estimated energy needs: 2000 calories 225 g carbohydrates 225 g protein 56 g fat  Progress Towards Goal(s):  In progress.   Nutritional Diagnosis:  [place]-3.2 Unintentional weight loss As related to stress for her mothers mental condition.  As evidenced by pt report, 24 hr recall, BMI 18.4, wt loss of 15 from her usual wt.    Intervention:  Nutrition .  Teaching Method Utilized:  Visual Auditory Hands on  Barriers to learning/adherence to lifestyle change: stress  Demonstrated degree of understanding via:  Teach Back   Monitoring/Evaluation:  Dietary intake, exercise, and body weight in 3 month(s).

## 2016-03-03 NOTE — Patient Instructions (Addendum)
-  Make sure you take your medicine with food -Put your whey protein powder in your smoothie or any type of protein -Try to eat about 5 times a day: Every 3 hours starting about an hour or hour and a half after you wake up  1st meal: Smoothie: soy milk or cows milk, whey protein, fruit, vegetable  2nd meal: half or whole peanutbutter jelly sandwich with a glass of soy or cows  Milk-----Or you can try a peanut butter and banana sandwich   3rd meal: grilled chicken, creamed potatoes, greens  4th meal: leftovers from third meal  5th meal: trail mix or [place] protein bar  -Use whole cows milk and cheese in your creamed potatoes -When you are hungry EAT! Or you can drink a glass of milk -When you start to eat more soup eat creamy soups NOT broth soups

## 2016-03-04 ENCOUNTER — Encounter: Payer: Self-pay | Admitting: Skilled Nursing Facility1

## 2016-03-11 DIAGNOSIS — I10 Essential (primary) hypertension: Secondary | ICD-10-CM | POA: Diagnosis not present

## 2016-03-11 DIAGNOSIS — K219 Gastro-esophageal reflux disease without esophagitis: Secondary | ICD-10-CM | POA: Diagnosis not present

## 2016-03-17 DIAGNOSIS — C089 Malignant neoplasm of major salivary gland, unspecified: Secondary | ICD-10-CM | POA: Diagnosis not present

## 2016-03-17 DIAGNOSIS — Z9889 Other specified postprocedural states: Secondary | ICD-10-CM | POA: Diagnosis not present

## 2016-04-21 ENCOUNTER — Other Ambulatory Visit: Payer: Self-pay | Admitting: Obstetrics and Gynecology

## 2016-04-21 DIAGNOSIS — Z1231 Encounter for screening mammogram for malignant neoplasm of breast: Secondary | ICD-10-CM | POA: Diagnosis not present

## 2016-04-21 DIAGNOSIS — Z01419 Encounter for gynecological examination (general) (routine) without abnormal findings: Secondary | ICD-10-CM | POA: Diagnosis not present

## 2016-04-23 ENCOUNTER — Other Ambulatory Visit: Payer: Self-pay | Admitting: Obstetrics and Gynecology

## 2016-04-23 ENCOUNTER — Other Ambulatory Visit: Payer: Self-pay | Admitting: Internal Medicine

## 2016-04-23 ENCOUNTER — Other Ambulatory Visit: Payer: Self-pay | Admitting: Obstetrics & Gynecology

## 2016-04-23 DIAGNOSIS — R928 Other abnormal and inconclusive findings on diagnostic imaging of breast: Secondary | ICD-10-CM

## 2016-04-27 ENCOUNTER — Other Ambulatory Visit: Payer: Self-pay | Admitting: Obstetrics and Gynecology

## 2016-04-27 ENCOUNTER — Ambulatory Visit
Admission: RE | Admit: 2016-04-27 | Discharge: 2016-04-27 | Disposition: A | Discharge status: Home / Self Care | Payer: Medicare Other | Source: Ambulatory Visit | Attending: Obstetrics and Gynecology | Admitting: Obstetrics and Gynecology

## 2016-04-27 DIAGNOSIS — R928 Other abnormal and inconclusive findings on diagnostic imaging of breast: Secondary | ICD-10-CM

## 2016-04-27 DIAGNOSIS — R922 Inconclusive mammogram: Secondary | ICD-10-CM | POA: Diagnosis not present

## 2016-04-28 DIAGNOSIS — Z0001 Encounter for general adult medical examination with abnormal findings: Secondary | ICD-10-CM | POA: Diagnosis not present

## 2016-04-28 DIAGNOSIS — E785 Hyperlipidemia, unspecified: Secondary | ICD-10-CM | POA: Diagnosis not present

## 2016-04-28 DIAGNOSIS — I10 Essential (primary) hypertension: Secondary | ICD-10-CM | POA: Diagnosis not present

## 2016-04-28 DIAGNOSIS — R7309 Other abnormal glucose: Secondary | ICD-10-CM | POA: Diagnosis not present

## 2016-04-30 DIAGNOSIS — R7309 Other abnormal glucose: Secondary | ICD-10-CM | POA: Diagnosis not present

## 2016-04-30 DIAGNOSIS — E559 Vitamin D deficiency, unspecified: Secondary | ICD-10-CM | POA: Diagnosis not present

## 2016-04-30 DIAGNOSIS — E785 Hyperlipidemia, unspecified: Secondary | ICD-10-CM | POA: Diagnosis not present

## 2016-04-30 DIAGNOSIS — I10 Essential (primary) hypertension: Secondary | ICD-10-CM | POA: Diagnosis not present

## 2016-06-02 ENCOUNTER — Encounter: Payer: Medicare Other | Attending: Internal Medicine | Admitting: Skilled Nursing Facility1

## 2016-06-02 ENCOUNTER — Encounter: Payer: Self-pay | Admitting: Skilled Nursing Facility1

## 2016-06-02 DIAGNOSIS — Z713 Dietary counseling and surveillance: Secondary | ICD-10-CM | POA: Insufficient documentation

## 2016-06-02 DIAGNOSIS — R636 Underweight: Secondary | ICD-10-CM | POA: Insufficient documentation

## 2016-06-02 DIAGNOSIS — R634 Abnormal weight loss: Secondary | ICD-10-CM

## 2016-06-02 NOTE — Patient Instructions (Addendum)
-  Try to eat 3 meals a day  -Have lunch when you go back to your mothers house after Yoga  -Keep the granola bars on you  -If you have not eaten three meals throughout the day drink a whole protein ensure  -Get back to more movement 3 times a week

## 2016-06-02 NOTE — Progress Notes (Signed)
  Medical Nutrition Therapy:  Appt start time: 1400 end time:  1500.   Assessment:  Primary concerns today: referred for underwt. Pt returns having gained one pound.Pt states her mother is changing every day and losing more of her memories. Pt states she is better realizing nothing is going to change with her mother. Pt states she has gotten back to the gym. Pt states attending support groups for family members with alzheimers has really helped. Pt admitted she was lying about how much food she consumes not wanting to admit she does not eat enough throughout the day.   [place]:                        06/02/2016 Fat percent: 14.8%           18% Fat mass: 15.4 pounds    19.2 pounds FFM: 88.6 pounds             87.4 pounds TBW: 59.2                         58.2 Preferred Learning Style:   No preference indicated   Learning Readiness:   Ready  MEDICATIONS: See Lost   DIETARY INTAKE:  Usual eating pattern includes 1 meals and 1 snacks per day.  Everyday foods include none stated.  Avoided foods include none stated.   *Pt states she usually only has one two  Meals a day 24-hr recall:  B ( AM): smoothie with whey protein---oatmeal and toast Snk ( AM): protein bar---apple L ( PM):6 inch [place] subway sandwich on flat bread Snk ( PM): nuts D ( PM): meat and vegetable rice or baked potato Snk ( PM):  Beverages: banana, spinach, and avocado smoothie, water, probiotic juice, celery beat juice  Usual physical activity: 3 days a week yoga, aerobics 3 days for 30 minutes, walking 2 miles 2 times a week, milk and proteinj  Estimated energy needs: 2000 calories 225 g carbohydrates 225 g protein 56 g fat  Progress Towards Goal(s):  In progress.   Nutritional Diagnosis:  [place]-3.2 Unintentional weight loss As related to stress for her mothers mental condition.  As evidenced by pt report, 24 hr recall, BMI 18.4, wt loss of 15 from her usual wt. Goals: -Try to eat 3 meals a day  -Have lunch  when you go back to your mothers house after Yoga  -Keep the granola bars on you  -If you have not eaten three meals throughout the day drink a whole protein ensure  -Get back to more movement 3 times a week    Intervention:  Nutrition counseling for weight loss. Dietitian educated the pt on why she was losing muscle mass and worked on some strategies for her to eat more throughout the day. Pt was given several supplements such as powders, premade, and multivitamin.  Teaching Method Utilized:  Visual Auditory Hands on  Barriers to learning/adherence to lifestyle change: stress  Demonstrated degree of understanding via:  Teach Back   Monitoring/Evaluation:  Dietary intake, exercise, and body weight in 6 week(s).

## 2016-06-29 HISTORY — PX: BREAST BIOPSY: SHX20

## 2016-07-03 ENCOUNTER — Ambulatory Visit (HOSPITAL_COMMUNITY)
Admission: RE | Admit: 2016-07-03 | Discharge: 2016-07-03 | Disposition: A | Discharge status: Home / Self Care | Payer: Medicare Other | Source: Ambulatory Visit | Attending: Radiology | Admitting: Radiology

## 2016-07-03 DIAGNOSIS — C770 Secondary and unspecified malignant neoplasm of lymph nodes of head, face and neck: Secondary | ICD-10-CM

## 2016-07-03 DIAGNOSIS — I6501 Occlusion and stenosis of right vertebral artery: Secondary | ICD-10-CM | POA: Diagnosis not present

## 2016-07-03 DIAGNOSIS — Z9889 Other specified postprocedural states: Secondary | ICD-10-CM | POA: Diagnosis not present

## 2016-07-03 DIAGNOSIS — C089 Malignant neoplasm of major salivary gland, unspecified: Secondary | ICD-10-CM | POA: Insufficient documentation

## 2016-07-03 DIAGNOSIS — Z0389 Encounter for observation for other suspected diseases and conditions ruled out: Secondary | ICD-10-CM | POA: Diagnosis not present

## 2016-07-03 LAB — CREATININE, SERUM: CREATININE: 0.72 mg/dL (ref 0.44–1.00)

## 2016-07-03 MED ORDER — GADOBENATE DIMEGLUMINE 529 MG/ML IV SOLN
10.0000 mL | Freq: Once | INTRAVENOUS | Status: AC
  Administered 2016-07-03: 10 mL via INTRAVENOUS

## 2016-07-15 ENCOUNTER — Ambulatory Visit: Payer: Self-pay | Admitting: Skilled Nursing Facility1

## 2016-07-28 ENCOUNTER — Encounter: Payer: Medicare Other | Attending: Internal Medicine | Admitting: Skilled Nursing Facility1

## 2016-07-28 ENCOUNTER — Ambulatory Visit: Payer: Self-pay | Admitting: Skilled Nursing Facility1

## 2016-07-28 ENCOUNTER — Encounter: Payer: Self-pay | Admitting: Skilled Nursing Facility1

## 2016-07-28 DIAGNOSIS — R633 Feeding difficulties, unspecified: Secondary | ICD-10-CM

## 2016-07-28 DIAGNOSIS — Z713 Dietary counseling and surveillance: Secondary | ICD-10-CM | POA: Diagnosis not present

## 2016-07-28 DIAGNOSIS — R636 Underweight: Secondary | ICD-10-CM | POA: Diagnosis not present

## 2016-07-28 DIAGNOSIS — Z681 Body mass index (BMI) 19 or less, adult: Secondary | ICD-10-CM | POA: Insufficient documentation

## 2016-07-28 NOTE — Progress Notes (Signed)
Medical Nutrition Therapy:  Appt start time: 1400 end time:  1500.   Assessment:  Primary concerns today: referred for underwt. Pt returns having gained about 3 pounds with one pound of muscle wt gained!! Pt states she has been doing wt training.  Pt states he is still stressed but dealing with it (her mother having alzheimer's and still living by herself). Pt states lorazepam has helped. Pt states her stomach cramps but when she eats the cramping stops. Pt states she tries to sleep from 10pm to 8am. Pt states she was constipated last week. Pt states she feels like she needs to eat every 2 hours or so.   [place]:                        06/02/2016            07/28/2016 Fat percent: 14.8%           18%                      Fat mass: 15.4 pounds    19.2 pounds        20.8 FFM: 88.6 pounds             87.4 pounds        88.2 TBW: 59.2                         58.2                    59.2 Preferred Learning Style:   No preference indicated   Learning Readiness:   Ready  MEDICATIONS: See Lost   DIETARY INTAKE:  Everyday foods include none stated.  Avoided foods include none stated.    24-hr recall:  B ( AM): oatmeal and toast and [place] bacon OR protein shake Snk ( AM): protein bar---apple----milk with protein powder----ensure L ( PM):6 inch [place] subway sandwich on flat bread----spinach and crust with sweet potatoes  Snk ( PM): nuts D ( PM): meat and vegetable rice or baked potato Snk ( PM):  Beverages:tea, whole milk, whey protein in the milk, ensure  Usual physical activity: 3 days a week yoga, aerobics 3 days for 30 minutes, walking 2 miles 2 times a week, milk and proteinj  Estimated energy needs: 2000 calories 225 g carbohydrates 225 g protein 56 g fat  Progress Towards Goal(s):  In progress.   Nutritional Diagnosis:  [place]-3.2 Unintentional weight loss As related to stress for her mothers mental condition.  As evidenced by pt report, 24 hr recall, BMI 18.4, wt loss of 15  from her usual wt. Goals: -Continue to be constant with your meals and snacks: eating every 2 to 2.5 hours   -To keep from being constipated:   -Be sure to get at least 60 ounces of fluid    -Eat fruits, vegetables, whole grains every day    -Continue to be active   -Try kefir: in a smoothie form in the yogurt aisle   -Eat whatever you want whenever you want  -Aim for energy and protein    Intervention:  Nutrition counseling for weight loss. Dietitian educated the pt on why she was losing muscle mass and worked on some strategies for her to eat more throughout the day. Pt was given several supplements such as powders, premade, and multivitamin.  Teaching Method Utilized:  Visual Auditory Hands on  Barriers to  learning/adherence to lifestyle change: stress  Demonstrated degree of understanding via:  Teach Back   Monitoring/Evaluation:  Dietary intake, exercise, and body weight

## 2016-07-28 NOTE — Patient Instructions (Addendum)
-  Continue to be constant with your meals and snacks: eating every 2 to 2.5 hours   -To keep from being constipated:   -Be sure to get at least 60 ounces of fluid    -Eat fruits, vegetables, whole grains every day    -Continue to be active   -Try kefir: in a smoothie form in the yogurt aisle   -Eat whatever you want whenever you want  -Aim for energy and protein

## 2016-08-06 ENCOUNTER — Encounter: Payer: Self-pay | Admitting: Internal Medicine

## 2016-08-06 ENCOUNTER — Ambulatory Visit (INDEPENDENT_AMBULATORY_CARE_PROVIDER_SITE_OTHER): Payer: Medicare Other | Admitting: Internal Medicine

## 2016-08-06 ENCOUNTER — Telehealth: Payer: Self-pay | Admitting: Internal Medicine

## 2016-08-06 VITALS — BP 118/78 | HR 78 | Temp 97.9°F | Ht 63.0 in | Wt 110.2 lb

## 2016-08-06 DIAGNOSIS — F4322 Adjustment disorder with anxiety: Secondary | ICD-10-CM | POA: Diagnosis not present

## 2016-08-06 DIAGNOSIS — I10 Essential (primary) hypertension: Secondary | ICD-10-CM | POA: Diagnosis not present

## 2016-08-06 DIAGNOSIS — C310 Malignant neoplasm of maxillary sinus: Secondary | ICD-10-CM | POA: Diagnosis not present

## 2016-08-06 MED ORDER — HYOSCYAMINE SULFATE SL 0.125 MG SL SUBL: 0.1250 mg | SUBLINGUAL_TABLET | Freq: Four times a day (QID) | SUBLINGUAL | 0 refills | Status: DC | PRN

## 2016-08-06 MED ORDER — LORAZEPAM 1 MG PO TABS: 0.5000 mg | ORAL_TABLET | Freq: Three times a day (TID) | ORAL | 0 refills | Status: DC | PRN

## 2016-08-06 NOTE — Telephone Encounter (Signed)
Pt would like to know if Dr. Raliegh Ip was going to give to her a Rx for the abdomen pain that she is having they discussed it but she did not receive the Rx before leaving the office today.  Pharm:  [facility].

## 2016-08-06 NOTE — Progress Notes (Signed)
Subjective:    Patient ID: Brandy Ramos, female    DOB: May 20, 1951, 66 y.o.   MRN: [id number]  HPI 66 year old patient who has a history of essential hypertension. She has been followed closely by oncology with a history of neoplasm of the maxillary sinuses.  She has also been followed by ENT.  She has had a recent nutrition consult and her weight seems to be improving. She is requesting a refill on alprazolam which has been quite helpful for situational anxiety  Past Medical History:  Diagnosis Date  . Anxiety state, unspecified 06/16/2007  . 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 DEPRESSION 11/04/2007  . HYPERTENSION 12/11/2008  . INSOMNIA 06/16/2007  . Malignant neoplasm of maxillary sinus ([place]) 04/19/2009  . OSTEOPOROSIS 11/04/2007   S/P 5 years of Fosamax therapy  . Palpitations 07/11/2008  . S/P radiation > 12 weeks 05/30/2009-07/17/09   completed radiation therapy January 2011  . S/P radiation therapy greater than twelve weeks ago 07/04/12-08/18/12   r neck& draining lymph nodes,59.4Gy/316fx  . SHOULDER PAIN, RIGHT 05/29/2008     Social History   Social History  . Marital status: Married    Spouse name: N/A  . Number of children: N/A  . Years of education: N/A   Occupational History  . Not on file.   Social History Main Topics  . Smoking status: Never Smoker  . Smokeless tobacco: Never Used  . Alcohol use Yes     Comment: very rare glass of wine, occ'l beer  . Drug use: No  . Sexual activity: Not on file   Other Topics Concern  . Not on file   Social History Narrative   The patient is married and has 3 children.   The patient is a nonsmoker.  Patient has never used smokeless tobacco products.   The patient with rare use of alcohol, drinking wine or beer.   The patient's mother is alive at age is 61 with a history of heart disease, multiple stents, and  hypertension.   The father passed away at the age of 62 from Colon Cancer    Past Surgical History:  Procedure Laterality Date  . CESAREAN SECTION    . right partial maxillectomy  02/2009  . right selective neck dissection  05/23/2012  . SKIN GRAFT  2010   right maxillectomy    Family History  Problem Relation Age of Onset  . Heart disease Mother   . Hypertension Mother   . Cancer Father     No Known Allergies  Current Outpatient Prescriptions on File Prior to Visit  Medication Sig Dispense Refill  . Calcium Carbonate-Vitamin D (CALCIUM 600/VITAMIN D) 600-400 MG-UNIT per chew tablet Chew 1 tablet by mouth daily.    . Multiple Vitamins-Minerals (MULTIVITAMIN PO) Take 1 tablet by mouth daily.     No current facility-administered medications on file prior to visit.     BP 118/78 (BP Location: Right Arm, Patient Position: Sitting, Cuff Size: Normal)   Pulse 78   Temp 97.9 F (36.6 C) (Oral)   Ht 5\' 3"  (1.6 m)   Wt 110 lb 3.2 oz (50 kg)   SpO2 97%   BMI 19.52 kg/m      Review of Systems  Constitutional: Positive for appetite change, fatigue and unexpected weight change.  HENT: Negative for congestion, dental problem, hearing loss, rhinorrhea, sinus pressure, sore throat and tinnitus.  Eyes: Negative for pain, discharge and visual disturbance.  Respiratory: Negative for cough and shortness of breath.   Cardiovascular: Negative for chest pain, palpitations and leg swelling.  Gastrointestinal: Negative for abdominal distention, abdominal pain, blood in stool, constipation, diarrhea, nausea and vomiting.  Genitourinary: Negative for difficulty urinating, dysuria, flank pain, frequency, hematuria, pelvic pain, urgency, vaginal bleeding, vaginal discharge and vaginal pain.  Musculoskeletal: Negative for arthralgias, gait problem and joint swelling.  Skin: Negative for rash.  Neurological: Negative for dizziness, syncope, speech difficulty, weakness, numbness and headaches.    Hematological: Negative for adenopathy.  Psychiatric/Behavioral: Negative for agitation, behavioral problems and dysphoric mood. The patient is nervous/anxious.        Objective:   Physical Exam  Constitutional: She is oriented to person, place, and time. She appears well-developed and well-nourished.  Blood pressure low normal Thin no acute distress  HENT:  Head: Normocephalic.  Right Ear: External ear normal.  Left Ear: External ear normal.  Mouth/Throat: Oropharynx is clear and moist.  Eyes: Conjunctivae and EOM are normal. Pupils are equal, round, and reactive to light.  Neck: Normal range of motion. Neck supple. No thyromegaly present.  Cardiovascular: Normal rate, regular rhythm, normal heart sounds and intact distal pulses.   Pulmonary/Chest: Effort normal and breath sounds normal.  Abdominal: Soft. Bowel sounds are normal. She exhibits no mass. There is no tenderness.  Musculoskeletal: Normal range of motion.  Lymphadenopathy:    She has no cervical adenopathy.  Neurological: She is alert and oriented to person, place, and time.  Skin: Skin is warm and dry. No rash noted.  Psychiatric: She has a normal mood and affect. Her behavior is normal.          Assessment & Plan:   Hypertension, well-controlled.  Blood pressure well controlled on amlodipine.  Hydrochlorothiazide has been discontinued. Anxiety disorder.  Lorazepam refilled Adenocarcinoma of salivary gland.  Follow-up oncology  Annual exam here in 6 months  Brandy Ramos

## 2016-08-06 NOTE — Telephone Encounter (Signed)
Spoke with pt stressed to continue advise of nutritional support team. Informed her that a Rx was sent to [facility] for generic Levsin 0.125 #30 one every 6 hours as needed for abdominal pain. Pt verbalized understanding.

## 2016-08-06 NOTE — Telephone Encounter (Signed)
See message below, please advise.

## 2016-08-06 NOTE — Patient Instructions (Signed)
Take a calcium supplement, plus [phone] units of vitamin D  Please check your blood pressure on a regular basis.  If it is consistently greater than 150/90, please make an office appointment.    It is important that you exercise regularly, at least 20 minutes 3 to 4 times per week.  If you develop chest pain or shortness of breath seek  medical attention.  Return in 6 months for follow-up

## 2016-08-06 NOTE — Telephone Encounter (Signed)
Continue advise of nutritional support team. Please call in a prescription for generic Levsin 0.125 #30 one every 6 hours as needed for abdominal pain

## 2016-08-06 NOTE — Progress Notes (Signed)
Pre visit review using our clinic review tool, if applicable. No additional management support is needed unless otherwise documented below in the visit note. 

## 2016-08-17 DIAGNOSIS — E785 Hyperlipidemia, unspecified: Secondary | ICD-10-CM | POA: Diagnosis not present

## 2016-08-17 DIAGNOSIS — K219 Gastro-esophageal reflux disease without esophagitis: Secondary | ICD-10-CM | POA: Diagnosis not present

## 2016-08-17 DIAGNOSIS — I200 Unstable angina: Secondary | ICD-10-CM | POA: Diagnosis not present

## 2016-08-17 DIAGNOSIS — I10 Essential (primary) hypertension: Secondary | ICD-10-CM | POA: Diagnosis not present

## 2016-08-17 DIAGNOSIS — R9439 Abnormal result of other cardiovascular function study: Secondary | ICD-10-CM | POA: Diagnosis not present

## 2016-08-18 DIAGNOSIS — I10 Essential (primary) hypertension: Secondary | ICD-10-CM | POA: Diagnosis not present

## 2016-08-18 DIAGNOSIS — E785 Hyperlipidemia, unspecified: Secondary | ICD-10-CM | POA: Diagnosis not present

## 2016-08-18 DIAGNOSIS — I2510 Atherosclerotic heart disease of native coronary artery without angina pectoris: Secondary | ICD-10-CM | POA: Diagnosis not present

## 2016-08-25 ENCOUNTER — Encounter (HOSPITAL_COMMUNITY)
Admission: RE | Disposition: A | Discharge status: Home / Self Care | Payer: Self-pay | Source: Ambulatory Visit | Attending: Cardiology

## 2016-08-25 ENCOUNTER — Ambulatory Visit (HOSPITAL_COMMUNITY)
Admission: RE | Admit: 2016-08-25 | Discharge: 2016-08-25 | Disposition: A | Discharge status: Home / Self Care | Payer: Medicare Other | Source: Ambulatory Visit | Attending: Cardiology | Admitting: Cardiology

## 2016-08-25 ENCOUNTER — Encounter (HOSPITAL_COMMUNITY): Payer: Self-pay | Admitting: Cardiology

## 2016-08-25 DIAGNOSIS — Z8249 Family history of ischemic heart disease and other diseases of the circulatory system: Secondary | ICD-10-CM | POA: Diagnosis not present

## 2016-08-25 DIAGNOSIS — I200 Unstable angina: Secondary | ICD-10-CM | POA: Diagnosis not present

## 2016-08-25 DIAGNOSIS — E785 Hyperlipidemia, unspecified: Secondary | ICD-10-CM | POA: Diagnosis not present

## 2016-08-25 DIAGNOSIS — R9439 Abnormal result of other cardiovascular function study: Secondary | ICD-10-CM | POA: Diagnosis not present

## 2016-08-25 DIAGNOSIS — I10 Essential (primary) hypertension: Secondary | ICD-10-CM | POA: Diagnosis not present

## 2016-08-25 HISTORY — PX: LEFT HEART CATH AND CORONARY ANGIOGRAPHY: CATH118249

## 2016-08-25 SURGERY — LEFT HEART CATH AND CORONARY ANGIOGRAPHY

## 2016-08-25 MED ORDER — SODIUM CHLORIDE 0.9 % IV SOLN: INTRAVENOUS | Status: AC

## 2016-08-25 MED ORDER — SODIUM CHLORIDE 0.9 % IV SOLN: 250.0000 mL | INTRAVENOUS | Status: DC | PRN

## 2016-08-25 MED ORDER — ONDANSETRON HCL 4 MG/2ML IJ SOLN: 4.0000 mg | Freq: Four times a day (QID) | INTRAMUSCULAR | Status: DC | PRN

## 2016-08-25 MED ORDER — FENTANYL CITRATE (PF) 100 MCG/2ML IJ SOLN
INTRAMUSCULAR | Status: AC
  Filled 2016-08-25: qty 2

## 2016-08-25 MED ORDER — SODIUM CHLORIDE 0.9 % WEIGHT BASED INFUSION: 1.0000 mL/kg/h | INTRAVENOUS | Status: DC

## 2016-08-25 MED ORDER — SODIUM CHLORIDE 0.9% FLUSH: 3.0000 mL | INTRAVENOUS | Status: DC | PRN

## 2016-08-25 MED ORDER — ACETAMINOPHEN 325 MG PO TABS: 650.0000 mg | ORAL_TABLET | ORAL | Status: DC | PRN

## 2016-08-25 MED ORDER — FENTANYL CITRATE (PF) 100 MCG/2ML IJ SOLN
INTRAMUSCULAR | Status: DC | PRN
  Administered 2016-08-25 (×2): 25 ug via INTRAVENOUS

## 2016-08-25 MED ORDER — IOPAMIDOL (ISOVUE-370) INJECTION 76%
INTRAVENOUS | Status: AC
  Filled 2016-08-25: qty 100

## 2016-08-25 MED ORDER — HEPARIN (PORCINE) IN NACL 2-0.9 UNIT/ML-% IJ SOLN
INTRAMUSCULAR | Status: DC | PRN
  Administered 2016-08-25: 1000 mL via INTRA_ARTERIAL

## 2016-08-25 MED ORDER — MIDAZOLAM HCL 2 MG/2ML IJ SOLN
INTRAMUSCULAR | Status: DC | PRN
  Administered 2016-08-25 (×2): 1 mg via INTRAVENOUS

## 2016-08-25 MED ORDER — LIDOCAINE HCL (PF) 1 % IJ SOLN
INTRAMUSCULAR | Status: AC
  Filled 2016-08-25: qty 30

## 2016-08-25 MED ORDER — LIDOCAINE HCL (PF) 1 % IJ SOLN
INTRAMUSCULAR | Status: DC | PRN
  Administered 2016-08-25: 20 mL

## 2016-08-25 MED ORDER — HEPARIN (PORCINE) IN NACL 2-0.9 UNIT/ML-% IJ SOLN
INTRAMUSCULAR | Status: AC
  Filled 2016-08-25: qty 1000

## 2016-08-25 MED ORDER — SODIUM CHLORIDE 0.9% FLUSH: 3.0000 mL | Freq: Two times a day (BID) | INTRAVENOUS | Status: DC

## 2016-08-25 MED ORDER — ASPIRIN 81 MG PO CHEW: 81.0000 mg | CHEWABLE_TABLET | ORAL | Status: DC

## 2016-08-25 MED ORDER — IOPAMIDOL (ISOVUE-370) INJECTION 76%
INTRAVENOUS | Status: DC | PRN
  Administered 2016-08-25: 60 mL via INTRA_ARTERIAL

## 2016-08-25 MED ORDER — MIDAZOLAM HCL 2 MG/2ML IJ SOLN
INTRAMUSCULAR | Status: AC
  Filled 2016-08-25: qty 2

## 2016-08-25 MED ORDER — SODIUM CHLORIDE 0.9 % WEIGHT BASED INFUSION
3.0000 mL/kg/h | INTRAVENOUS | Status: DC
  Administered 2016-08-25: 3 mL/kg/h via INTRAVENOUS

## 2016-08-25 SURGICAL SUPPLY — 8 items
CATH INFINITI 5 FR 3DRC (CATHETERS) ×2 IMPLANT
CATH INFINITI 5FR MULTPACK ANG (CATHETERS) ×2 IMPLANT
KIT HEART LEFT (KITS) ×3 IMPLANT
PACK CARDIAC CATHETERIZATION (CUSTOM PROCEDURE TRAY) ×3 IMPLANT
SHEATH PINNACLE 5F 10CM (SHEATH) ×2 IMPLANT
SYR MEDRAD MARK V 150ML (SYRINGE) ×3 IMPLANT
TRANSDUCER W/STOPCOCK (MISCELLANEOUS) ×3 IMPLANT
WIRE EMERALD 3MM-J .035X150CM (WIRE) ×2 IMPLANT

## 2016-08-25 NOTE — Progress Notes (Signed)
Site area: Right groin a 5 french sheath was removed  Site Prior to Removal:  Level 0  Pressure Applied For 15 MINUTES    Bedrest Beginning at 0840a  Manual:   Yes.    Patient Status During Pull:  stable  Post Pull Groin Site:  Level 0  Post Pull Instructions Given:  Yes.    Post Pull Pulses Present:  Yes.    Dressing Applied:  Yes.    Comments:  VS remain stable during sheath pull.

## 2016-08-25 NOTE — Interval H&P Note (Signed)
Cath Lab Visit (complete for each Cath Lab visit)  Clinical Evaluation Leading to the Procedure:   ACS: No.  Non-ACS:    Anginal Classification: CCS III  Anti-ischemic medical therapy: Maximal Therapy (2 or more classes of medications)  Non-Invasive Test Results: Intermediate-risk stress test findings: cardiac mortality 1-3%/year  Prior CABG: No previous CABG      History and Physical Interval Note:  08/25/2016 7:26 AM  Brandy Ramos  has presented today for surgery, with the diagnosis of cp  The various methods of treatment have been discussed with the patient and family. After consideration of risks, benefits and other options for treatment, the patient has consented to  Procedure(s): Left Heart Cath and Coronary Angiography (N/A) as a surgical intervention .  The patient's history has been reviewed, patient examined, no change in status, stable for surgery.  I have reviewed the patient's chart and labs.  Questions were answered to the patient's satisfaction.     Charolette Forward

## 2016-08-25 NOTE — H&P (Signed)
Dictated H&P in the chart needs to be scanned 

## 2016-08-25 NOTE — Discharge Instructions (Signed)
Femoral Site Care °Refer to this sheet in the next few weeks. These instructions provide you with information about caring for yourself after your procedure. Your health care provider may also give you more specific instructions. Your treatment has been planned according to current medical practices, but problems sometimes occur. Call your health care provider if you have any problems or questions after your procedure. °What can I expect after the procedure? °After your procedure, it is typical to have the following: °· Bruising at the site that usually fades within 1-2 weeks. °· Blood collecting in the tissue (hematoma) that may be painful to the touch. It should usually decrease in size and tenderness within 1-2 weeks. °Follow these instructions at home: °· Take medicines only as directed by your health care provider. °· You may shower 24-48 hours after the procedure or as directed by your health care provider. Remove the bandage (dressing) and gently wash the site with plain soap and water. Pat the area dry with a clean towel. Do not rub the site, because this may cause bleeding. °· Do not take baths, swim, or use a hot tub until your health care provider approves. °· Check your insertion site every day for redness, swelling, or drainage. °· Do not apply powder or lotion to the site. °· Limit use of stairs to twice a day for the first 2-3 days or as directed by your health care provider. °· Do not squat for the first 2-3 days or as directed by your health care provider. °· Do not lift over 10 lb (4.5 kg) for 5 days after your procedure or as directed by your health care provider. °· Ask your health care provider when it is okay to: °¨ Return to work or school. °¨ Resume usual physical activities or sports. °¨ Resume sexual activity. °· Do not drive home if you are discharged the same day as the procedure. Have someone else drive you. °· You may drive 24 hours after the procedure unless otherwise instructed by  your health care provider. °· Do not operate machinery or power tools for 24 hours after the procedure or as directed by your health care provider. °· If your procedure was done as an outpatient procedure, which means that you went home the same day as your procedure, a responsible adult should be with you for the first 24 hours after you arrive home. °· Keep all follow-up visits as directed by your health care provider. This is important. °Contact a health care provider if: °· You have a fever. °· You have chills. °· You have increased bleeding from the site. Hold pressure on the site. °Get help right away if: °· You have unusual pain at the site. °· You have redness, warmth, or swelling at the site. °· You have drainage (other than a small amount of blood on the dressing) from the site. °· The site is bleeding, and the bleeding does not stop after 30 minutes of holding steady pressure on the site. °· Your leg or foot becomes pale, cool, tingly, or numb. °This information is not intended to replace advice given to you by your health care provider. Make sure you discuss any questions you have with your health care provider. °Document Released: 02/16/2014 Document Revised: 11/21/2015 Document Reviewed: 01/02/2014 °Elsevier Interactive Patient Education © 2017 Elsevier Inc. ° °

## 2016-08-31 DIAGNOSIS — E785 Hyperlipidemia, unspecified: Secondary | ICD-10-CM | POA: Diagnosis not present

## 2016-08-31 DIAGNOSIS — I25111 Atherosclerotic heart disease of native coronary artery with angina pectoris with documented spasm: Secondary | ICD-10-CM | POA: Diagnosis not present

## 2016-08-31 DIAGNOSIS — K219 Gastro-esophageal reflux disease without esophagitis: Secondary | ICD-10-CM | POA: Diagnosis not present

## 2016-08-31 DIAGNOSIS — I10 Essential (primary) hypertension: Secondary | ICD-10-CM | POA: Diagnosis not present

## 2016-12-08 ENCOUNTER — Other Ambulatory Visit: Payer: Self-pay | Admitting: Internal Medicine

## 2016-12-15 DIAGNOSIS — Z8249 Family history of ischemic heart disease and other diseases of the circulatory system: Secondary | ICD-10-CM | POA: Diagnosis not present

## 2016-12-15 DIAGNOSIS — E785 Hyperlipidemia, unspecified: Secondary | ICD-10-CM | POA: Diagnosis not present

## 2016-12-15 DIAGNOSIS — K219 Gastro-esophageal reflux disease without esophagitis: Secondary | ICD-10-CM | POA: Diagnosis not present

## 2016-12-15 DIAGNOSIS — I10 Essential (primary) hypertension: Secondary | ICD-10-CM | POA: Diagnosis not present

## 2016-12-15 DIAGNOSIS — I201 Angina pectoris with documented spasm: Secondary | ICD-10-CM | POA: Diagnosis not present

## 2017-01-18 DIAGNOSIS — C770 Secondary and unspecified malignant neoplasm of lymph nodes of head, face and neck: Secondary | ICD-10-CM | POA: Diagnosis not present

## 2017-01-18 DIAGNOSIS — Z923 Personal history of irradiation: Secondary | ICD-10-CM | POA: Diagnosis not present

## 2017-01-18 DIAGNOSIS — C089 Malignant neoplasm of major salivary gland, unspecified: Secondary | ICD-10-CM | POA: Diagnosis not present

## 2017-01-25 ENCOUNTER — Ambulatory Visit: Payer: Self-pay | Admitting: Skilled Nursing Facility1

## 2017-02-02 ENCOUNTER — Other Ambulatory Visit: Payer: Self-pay | Admitting: Radiology

## 2017-02-02 DIAGNOSIS — C089 Malignant neoplasm of major salivary gland, unspecified: Secondary | ICD-10-CM

## 2017-02-02 DIAGNOSIS — C770 Secondary and unspecified malignant neoplasm of lymph nodes of head, face and neck: Secondary | ICD-10-CM

## 2017-02-13 ENCOUNTER — Ambulatory Visit
Admission: RE | Admit: 2017-02-13 | Discharge: 2017-02-13 | Disposition: A | Discharge status: Home / Self Care | Payer: Medicare Other | Source: Ambulatory Visit | Attending: Radiology | Admitting: Radiology

## 2017-02-13 DIAGNOSIS — C410 Malignant neoplasm of bones of skull and face: Secondary | ICD-10-CM | POA: Diagnosis not present

## 2017-02-13 DIAGNOSIS — C089 Malignant neoplasm of major salivary gland, unspecified: Secondary | ICD-10-CM

## 2017-02-13 DIAGNOSIS — C770 Secondary and unspecified malignant neoplasm of lymph nodes of head, face and neck: Secondary | ICD-10-CM

## 2017-02-13 MED ORDER — GADOBENATE DIMEGLUMINE 529 MG/ML IV SOLN
10.0000 mL | Freq: Once | INTRAVENOUS | Status: AC | PRN
Start: 2017-02-13 — End: 2017-02-13
  Administered 2017-02-13: 10 mL via INTRAVENOUS

## 2017-02-16 ENCOUNTER — Encounter: Payer: Self-pay | Admitting: Radiation Oncology

## 2017-02-16 NOTE — Progress Notes (Signed)
Rec'd a call from [place] at [facility], [phone], requesting previous radiation records and dosimetry records. Pulled records from [place] from Dr. Pablo Ledger and gave to Dr. Isidore Moos to approve being faxed. Gave to dosimetry the request that dosimetry records be emailed to [facility].

## 2017-02-26 DIAGNOSIS — C770 Secondary and unspecified malignant neoplasm of lymph nodes of head, face and neck: Secondary | ICD-10-CM | POA: Diagnosis not present

## 2017-02-26 DIAGNOSIS — C089 Malignant neoplasm of major salivary gland, unspecified: Secondary | ICD-10-CM | POA: Diagnosis not present

## 2017-03-03 DIAGNOSIS — C770 Secondary and unspecified malignant neoplasm of lymph nodes of head, face and neck: Secondary | ICD-10-CM | POA: Diagnosis not present

## 2017-03-03 DIAGNOSIS — Z79899 Other long term (current) drug therapy: Secondary | ICD-10-CM | POA: Diagnosis not present

## 2017-03-03 DIAGNOSIS — I10 Essential (primary) hypertension: Secondary | ICD-10-CM | POA: Diagnosis not present

## 2017-03-03 DIAGNOSIS — C089 Malignant neoplasm of major salivary gland, unspecified: Secondary | ICD-10-CM | POA: Diagnosis not present

## 2017-03-03 DIAGNOSIS — Z7982 Long term (current) use of aspirin: Secondary | ICD-10-CM | POA: Diagnosis not present

## 2017-03-04 ENCOUNTER — Other Ambulatory Visit: Payer: Self-pay | Admitting: Internal Medicine

## 2017-03-15 ENCOUNTER — Other Ambulatory Visit: Payer: Self-pay | Admitting: Internal Medicine

## 2017-03-16 NOTE — Telephone Encounter (Signed)
Pt is calling to check the status of the Rx

## 2017-03-17 DIAGNOSIS — I10 Essential (primary) hypertension: Secondary | ICD-10-CM | POA: Diagnosis not present

## 2017-03-17 DIAGNOSIS — J309 Allergic rhinitis, unspecified: Secondary | ICD-10-CM | POA: Diagnosis not present

## 2017-03-17 DIAGNOSIS — C760 Malignant neoplasm of head, face and neck: Secondary | ICD-10-CM | POA: Diagnosis not present

## 2017-03-26 ENCOUNTER — Other Ambulatory Visit: Payer: Self-pay | Admitting: Internal Medicine

## 2017-04-28 DIAGNOSIS — K219 Gastro-esophageal reflux disease without esophagitis: Secondary | ICD-10-CM | POA: Diagnosis not present

## 2017-04-28 DIAGNOSIS — I201 Angina pectoris with documented spasm: Secondary | ICD-10-CM | POA: Diagnosis not present

## 2017-04-28 DIAGNOSIS — Z8249 Family history of ischemic heart disease and other diseases of the circulatory system: Secondary | ICD-10-CM | POA: Diagnosis not present

## 2017-04-28 DIAGNOSIS — I10 Essential (primary) hypertension: Secondary | ICD-10-CM | POA: Diagnosis not present

## 2017-04-28 DIAGNOSIS — E785 Hyperlipidemia, unspecified: Secondary | ICD-10-CM | POA: Diagnosis not present

## 2017-05-07 ENCOUNTER — Ambulatory Visit (INDEPENDENT_AMBULATORY_CARE_PROVIDER_SITE_OTHER): Payer: Medicare Other | Admitting: Family Medicine

## 2017-05-07 ENCOUNTER — Encounter: Payer: Self-pay | Admitting: Family Medicine

## 2017-05-07 ENCOUNTER — Other Ambulatory Visit: Payer: Self-pay | Admitting: Obstetrics and Gynecology

## 2017-05-07 VITALS — BP 126/74 | Temp 98.3°F | Ht 63.5 in | Wt 115.0 lb

## 2017-05-07 DIAGNOSIS — R202 Paresthesia of skin: Secondary | ICD-10-CM

## 2017-05-07 DIAGNOSIS — N63 Unspecified lump in unspecified breast: Secondary | ICD-10-CM

## 2017-05-07 DIAGNOSIS — Z01419 Encounter for gynecological examination (general) (routine) without abnormal findings: Secondary | ICD-10-CM | POA: Diagnosis not present

## 2017-05-07 LAB — BASIC METABOLIC PANEL
BUN: 17 mg/dL (ref 6–23)
CO2: 31 meq/L (ref 19–32)
Calcium: 10 mg/dL (ref 8.4–10.5)
Chloride: 101 mEq/L (ref 96–112)
Creatinine, Ser: 0.95 mg/dL (ref 0.40–1.20)
GFR: 75.37 mL/min (ref >60.00)
GLUCOSE: 114 mg/dL — AB (ref 70–99)
POTASSIUM: 4.3 meq/L (ref 3.5–5.1)
SODIUM: 140 meq/L (ref 135–145)

## 2017-05-07 LAB — HEPATIC FUNCTION PANEL
ALBUMIN: 4.2 g/dL (ref 3.5–5.2)
ALK PHOS: 67 U/L (ref 39–117)
ALT: 15 U/L (ref 0–35)
AST: 17 U/L (ref 0–37)
Bilirubin, Direct: 0.1 mg/dL (ref 0.0–0.3)
TOTAL PROTEIN: 7 g/dL (ref 6.0–8.3)
Total Bilirubin: 0.4 mg/dL (ref 0.2–1.2)

## 2017-05-07 LAB — CBC WITH DIFFERENTIAL/PLATELET
BASOS ABS: 0 10*3/uL (ref 0.0–0.1)
Basophils Relative: 0.7 % (ref 0.0–3.0)
EOS ABS: 0.1 10*3/uL (ref 0.0–0.7)
Eosinophils Relative: 0.9 % (ref 0.0–5.0)
HCT: 40.1 % (ref 36.0–46.0)
Hemoglobin: 12.6 g/dL (ref 12.0–15.0)
LYMPHS ABS: 0.8 10*3/uL (ref 0.7–4.0)
Lymphocytes Relative: 13.6 % (ref 12.0–46.0)
MCHC: 31.3 g/dL (ref 30.0–36.0)
MCV: 72.7 fl — ABNORMAL LOW (ref 78.0–100.0)
Monocytes Absolute: 0.5 10*3/uL (ref 0.1–1.0)
Monocytes Relative: 7.9 % (ref 3.0–12.0)
NEUTROS ABS: 4.8 10*3/uL (ref 1.4–7.7)
NEUTROS PCT: 76.9 % (ref 43.0–77.0)
PLATELETS: 232 10*3/uL (ref 150.0–400.0)
RBC: 5.51 Mil/uL — ABNORMAL HIGH (ref 3.87–5.11)
RDW: 15.3 % (ref 11.5–15.5)
WBC: 6.2 10*3/uL (ref 4.0–10.5)

## 2017-05-07 LAB — TSH: TSH: 1.34 u[IU]/mL (ref 0.35–4.50)

## 2017-05-07 LAB — VITAMIN B12: Vitamin B-12: 703 pg/mL (ref 211–911)

## 2017-05-07 NOTE — Patient Instructions (Signed)
WE NOW OFFER   [facility] FAST TRACK!!!  SAME DAY Appointments for ACUTE CARE  Such as: Sprains, Injuries, cuts, abrasions, rashes, muscle pain, joint pain, back pain Colds, flu, sore throats, headache, allergies, cough, fever  Ear pain, sinus and eye infections Abdominal pain, nausea, vomiting, diarrhea, upset stomach Animal/insect bites  3 Easy Ways to Schedule: Walk-In Scheduling Call in scheduling Mychart Sign-up: [email or web address]         

## 2017-05-07 NOTE — Progress Notes (Signed)
   Subjective:    Patient ID: Brandy Ramos, female    DOB: May 25, 1951, 66 y.o.   MRN: [id number]  HPI Here for 2 weeks of intermittent numbness and tingling in both arms and both legs, the right leg being the most affected. There is no pain or weakness. No back or neck pain. No recent change in medications.    Review of Systems  Constitutional: Negative.   Respiratory: Negative.   Cardiovascular: Negative.   Endocrine: Negative.   Neurological: Positive for numbness. Negative for weakness and headaches.       Objective:   Physical Exam  Constitutional: She is oriented to person, place, and time. She appears well-developed and well-nourished.  Neck: No thyromegaly present.  Cardiovascular: Normal rate, regular rhythm, normal heart sounds and intact distal pulses.  Pulmonary/Chest: Effort normal and breath sounds normal. No respiratory distress. She has no wheezes. She has no rales.  Musculoskeletal:  Low back is intact, full ROM, not tender   Lymphadenopathy:    She has no cervical adenopathy.  Neurological: She is alert and oriented to person, place, and time. No cranial nerve deficit. She exhibits normal muscle tone. Coordination normal.          Assessment & Plan:  Paresthesias, we will send her for lab work to investigate.  Alysia Penna, MD

## 2017-05-12 ENCOUNTER — Other Ambulatory Visit: Payer: Self-pay | Admitting: Obstetrics and Gynecology

## 2017-05-12 ENCOUNTER — Other Ambulatory Visit: Payer: Self-pay

## 2017-05-12 ENCOUNTER — Ambulatory Visit
Admission: RE | Admit: 2017-05-12 | Discharge: 2017-05-12 | Disposition: A | Discharge status: Home / Self Care | Payer: Medicare Other | Source: Ambulatory Visit | Attending: Obstetrics and Gynecology | Admitting: Obstetrics and Gynecology

## 2017-05-12 ENCOUNTER — Ambulatory Visit: Payer: Self-pay

## 2017-05-12 DIAGNOSIS — R921 Mammographic calcification found on diagnostic imaging of breast: Secondary | ICD-10-CM

## 2017-05-12 DIAGNOSIS — R922 Inconclusive mammogram: Secondary | ICD-10-CM | POA: Diagnosis not present

## 2017-05-12 DIAGNOSIS — N63 Unspecified lump in unspecified breast: Secondary | ICD-10-CM

## 2017-05-13 ENCOUNTER — Ambulatory Visit
Admission: RE | Admit: 2017-05-13 | Discharge: 2017-05-13 | Disposition: A | Discharge status: Home / Self Care | Payer: Medicare Other | Source: Ambulatory Visit | Attending: Obstetrics and Gynecology | Admitting: Obstetrics and Gynecology

## 2017-05-13 DIAGNOSIS — R921 Mammographic calcification found on diagnostic imaging of breast: Secondary | ICD-10-CM | POA: Diagnosis not present

## 2017-05-13 DIAGNOSIS — R920 Mammographic microcalcification found on diagnostic imaging of breast: Secondary | ICD-10-CM | POA: Diagnosis not present

## 2017-05-13 DIAGNOSIS — D241 Benign neoplasm of right breast: Secondary | ICD-10-CM | POA: Diagnosis not present

## 2017-05-13 MED ORDER — LORAZEPAM 1 MG PO TABS: ORAL_TABLET | ORAL | 0 refills | Status: DC

## 2017-06-09 DIAGNOSIS — E559 Vitamin D deficiency, unspecified: Secondary | ICD-10-CM | POA: Diagnosis not present

## 2017-06-09 DIAGNOSIS — K219 Gastro-esophageal reflux disease without esophagitis: Secondary | ICD-10-CM | POA: Diagnosis not present

## 2017-06-09 DIAGNOSIS — I10 Essential (primary) hypertension: Secondary | ICD-10-CM | POA: Diagnosis not present

## 2017-06-09 DIAGNOSIS — Z0001 Encounter for general adult medical examination with abnormal findings: Secondary | ICD-10-CM | POA: Diagnosis not present

## 2017-06-09 DIAGNOSIS — E785 Hyperlipidemia, unspecified: Secondary | ICD-10-CM | POA: Diagnosis not present

## 2017-06-09 DIAGNOSIS — R7309 Other abnormal glucose: Secondary | ICD-10-CM | POA: Diagnosis not present

## 2017-06-15 ENCOUNTER — Other Ambulatory Visit: Payer: Self-pay | Admitting: Radiology

## 2017-06-15 DIAGNOSIS — C770 Secondary and unspecified malignant neoplasm of lymph nodes of head, face and neck: Secondary | ICD-10-CM

## 2017-06-18 ENCOUNTER — Telehealth: Payer: Self-pay | Admitting: *Deleted

## 2017-06-18 NOTE — Telephone Encounter (Signed)
Oncology Nurse Navigator Documentation  Placed introductory call to new referral patient.  Introduced myself as the H&N oncology nurse navigator that works with Dr. Isidore Moos to whom she has been referred by Dr. Vallarie Mare, [facility].  She confirmed her understanding of referral and appt date/time of 07/06/17 0830.  I briefly explained my role as her navigator, indicated that I would be joining her during her appt.    I confirmed her understanding of [place] location, explained arrival and RadOnc registration process.  She voiced familiarity as she was treated by Dr. Pablo Ledger in early 2014.  I provided my contact information, encouraged her to call with questions/concerns before her appt.  She verbalized understanding of information provided, expressed appreciation for my call.  Gayleen Orem, RN, BSN, [place] Oncology Nurse [place] at [place] [phone]

## 2017-06-30 ENCOUNTER — Ambulatory Visit
Admission: RE | Admit: 2017-06-30 | Discharge: 2017-06-30 | Disposition: A | Discharge status: Home / Self Care | Payer: Self-pay | Source: Ambulatory Visit | Attending: Radiation Oncology | Admitting: Radiation Oncology

## 2017-06-30 ENCOUNTER — Other Ambulatory Visit: Payer: Self-pay | Admitting: Radiation Oncology

## 2017-06-30 ENCOUNTER — Encounter: Payer: Self-pay | Admitting: Radiation Oncology

## 2017-06-30 DIAGNOSIS — C801 Malignant (primary) neoplasm, unspecified: Secondary | ICD-10-CM

## 2017-06-30 NOTE — Progress Notes (Signed)
Head and Neck Cancer Location of Tumor / Histology:   Patient presented months ago with symptoms of: A new lymph node was discovered and she was referred here for a location closer to home.   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  03/2012 FNA of right submandibular node positive for adenocarcinoma.   Nutrition Status Yes No Comments  Weight changes? [x]  []  She has gained about 11 lbs over the past two years.   Swallowing concerns? [x]  []    PEG? []  [x]     Referrals Yes No Comments  Social Work? []  [x]    Dentistry? []  [x]    Swallowing therapy? []  [x]    Nutrition? []  [x]    Med/Onc? []  [x]     Safety Issues Yes No Comments  Prior radiation? [x]  []  66 gray at 2 gray per fraction x33 fractions to the right retromolar trigone which she completed in January of 2011 per Dr. Unknown Jim note 05/09/13  Radiation treatment dates:  07/04/2012-08/18/2012 Site/dose:   Right neck and draining lymph nodes / 59.4 Gy in 33 fractions-- Dr. Pablo Ledger.    she developed a recurrent cancer in the same area and  was referred to [facility] and received fractionated radiosurgery 32 Gy in 4 fractions August 2014  11/08/15 GK extend 32 Gy in 4 fractions to Left Retropharyngeal LN  Pacemaker/ICD? []  [x]    Possible current pregnancy? []  [x]    Is the patient on methotrexate? []  [x]     Tobacco/Marijuana/Snuff/ETOH use: She is not smoking. She drinks alcohol socially.   Past/Anticipated interventions by otolaryngology, if any:  Dr. Nicolette Bang: --03/22/2009 Surgery  Partial maxillectomy  --03/2012 Relapse  FNA of right submandibular node (+) for adenocarcinoma  --04/2012 Surgery  Resection with 2/20 LN involved  Past/Anticipated interventions by medical oncology, if any:  None  Current Complaints / other details:  03/03/17 note by Dr. Ala Bent  (resident supervised by Dr. 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 Nicolette Bang Radiation Oncologist: Dr. Cordelia Pen, Dr. Vallarie Mare, Dr. Pablo Ledger  --Adenocarcinoma of salivary gland [facility])  01/2009 Initial Diagnosis  Polymorphous low-grade adenocarcinoma of the right maxilla  --03/22/2009 Surgery  Partial maxillectomy  --04/2009 - Radiation Therapy  66 Gy in 33 fractions to postoperative bed  --03/2012 Relapse  FNA of right submandibular node (+) for adenocarcinoma  --04/2012 Surgery  Resection with 2/20 LN involved  --08/2012 - Radiation Therapy  Re-irradiation of right neck and submandibular node; 59.4 Gy in 33 fractions  --12/2012 Relapse  Relapse in right retropharyngeal LN  --01/2013 - Radiation Therapy  Gamma Knife Extend- 32 Gy in 4 fractions to a right retropharyngeal lymph node  --09/2015 Relapse  Relapse in left retropharyngeal LN  --11/08/2015 - Radiation Therapy  GK extend 32 Gy in 4 fractions to left retropharyngeal LN  --01/2017 Relapse  Relapse in right retropharyngeal LN  --Assessment and Plan: Navpreet Szczygiel is a 67 y.o. with a history of recurrent low grade adenocarcinoma with a right retropharyngeal LN. we discussed the different treatment options that might be available to her, which include reradiation, surgical resection, and active surveillance. We stated that chemotherapy does not have much of a role in this disease. Dr. Vicie Mutters in ENT may be able to resect or retropharyngeal lymph nodes, but we will discuss her at an upcoming tumor board. Reirradiation is feasible,  however it does carry the risk of carotid blowout, bone and soft tissue necrosis, and brainstem injury resulting in worse quality of life. We also offered active surveillance, in which the patient would return every 3 months for further imaging and decide when to per surgery to pursue further imaging  further treatment. The patient weighed her options and has elected for active surveillance at this time. However we stated we will discuss her at the upcoming ENT tumor board to discuss the feasibility of performing a resection of the retropharyngeal lymph nodes.   BP 138/88   Pulse 82   Temp 98.3 F (36.8 C)   Ht 5' 3.5" (1.613 m)   Wt 115 lb 12.8 oz (52.5 kg)   SpO2 100% Comment: room air  BMI 20.19 kg/m    Wt Readings from Last 3 Encounters:  07/06/17 115 lb 12.8 oz (52.5 kg)  05/07/17 115 lb (52.2 kg)  08/25/16 110 lb (49.9 kg)

## 2017-07-01 ENCOUNTER — Telehealth: Payer: Self-pay | Admitting: *Deleted

## 2017-07-01 NOTE — Telephone Encounter (Signed)
Oncology Nurse Navigator Documentation  Brandy Ramos called to see if 1/8 appt with Dr. 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  I encouraged her to keep appt, indicated future appts can be worked around her volunteer activities.  She voiced understanding.  Gayleen Orem, RN, BSN, [place] Neck Oncology Nurse [place] at [place] [phone]

## 2017-07-06 ENCOUNTER — Encounter: Payer: Self-pay | Admitting: General Practice

## 2017-07-06 ENCOUNTER — Encounter: Payer: Self-pay | Admitting: Radiation Oncology

## 2017-07-06 ENCOUNTER — Encounter: Payer: Self-pay | Admitting: *Deleted

## 2017-07-06 ENCOUNTER — Ambulatory Visit
Admission: RE | Admit: 2017-07-06 | Discharge: 2017-07-06 | Disposition: A | Discharge status: Home / Self Care | Payer: Medicare Other | Source: Ambulatory Visit | Attending: Radiation Oncology | Admitting: Radiation Oncology

## 2017-07-06 VITALS — BP 138/88 | HR 82 | Temp 98.3°F | Ht 63.5 in | Wt 115.8 lb

## 2017-07-06 DIAGNOSIS — Z7982 Long term (current) use of aspirin: Secondary | ICD-10-CM | POA: Insufficient documentation

## 2017-07-06 DIAGNOSIS — C310 Malignant neoplasm of maxillary sinus: Secondary | ICD-10-CM | POA: Diagnosis not present

## 2017-07-06 DIAGNOSIS — Z79899 Other long term (current) drug therapy: Secondary | ICD-10-CM | POA: Insufficient documentation

## 2017-07-06 DIAGNOSIS — I10 Essential (primary) hypertension: Secondary | ICD-10-CM | POA: Diagnosis not present

## 2017-07-06 DIAGNOSIS — F329 Major depressive disorder, single episode, unspecified: Secondary | ICD-10-CM | POA: Diagnosis not present

## 2017-07-06 HISTORY — DX: Personal history of irradiation: Z92.3

## 2017-07-06 NOTE — Progress Notes (Signed)
[place] Psychosocial Distress Screening Clinical Social Work  Clinical Social Work was referred by distress screening protocol.  The patient scored a 5 on the Psychosocial Distress Thermometer which indicates moderate distress. Clinical Social Worker Edwyna Shell to assess for distress and other psychosocial needs. Unab;e to reach patient, left VM detailing services offered by [facility] as well as CSW contact information.  Encouraged patient to call back so we can assist as needed.    ONCBCN DISTRESS SCREENING 07/06/2017  Screening Type Initial Screening  Distress experienced in past week (1-10) 5  Emotional problem type Depression;Nervousness/Anxiety    Clinical Social Worker follow up needed: Yes.  Await return call from patient to assess for needed resources.  If yes, follow up plan:   Edwyna Shell, LCSW Clinical Social Worker Phone:  [phone]

## 2017-07-06 NOTE — Progress Notes (Signed)
Oncology Nurse Navigator Documentation  Met with patient during initial consult with Dr. Isidore Moos.  She was unaccompanied.    1. Further introduced myself as her Navigator, explained my role as a member of the Care Team.   2. Provided New Patient Information packet, discussed contents:  Contact information for physician(s), myself, other members of the Care Team.  Advance Directive information ([place] blue pamphlet with LCSW contact info); provided [facility] AD forms.  Fall Prevention Patient Safety Plan  Appointment Guideline  Financial Assistance Information sheet  [place] with highlight of [employer] [facility]  SLP information sheet 3. Provided introductory explanation of radiation treatment including SIM planning and purpose of Aquaplast head and shoulder mask, showed her example of closed and open-face mask. She indicated familiarity with closed-face mask as she rec'd tmt at [facility] late 2010.  She indicated preference for open-face mask if further RT indicated as she experienced difficulty during previous tmt.   4. She voiced understanding Dr. Isidore Moos will consult further with Dr. Vallarie Mare [facility] re treatment s/p tomorrow's MRI. 5. I encouraged her contact me with questions/concerns as treatments/procedures begin. She voiced understanding.  Gayleen Orem, RN, BSN Head & Neck Oncology Nurse [place] at [place] [phone]

## 2017-07-06 NOTE — Progress Notes (Addendum)
Radiation Oncology         [phone]) [phone] ________________________________  Initial outpatient Consultation  Name: Brandy Ramos MRN: [id number]  Date: 07/06/2017  DOB: Mar 19, 1951  YW:[id number], Doretha Sou, MD  Gerarda Fraction, MD   REFERRING PHYSICIAN: Gerarda Fraction, MD  DIAGNOSIS:    ICD-10-CM   1. 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  HISTORY OF PRESENT ILLNESS::Brandy Ramos is a 67 y.o. 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 She underwent partial maxillectomy on 03/22/09 with Dr. Nicolette Bang. This was followed by radiation therapy under the care of Dr. Pablo Ledger. The patient underwent 66 Gy in 33 fractions to the postoperative bed completed in January 2011. In October 2013, the patient relapsed and FNA of the right submandibular node was positive for adenocarcinoma. She underwent resection in November 2013 with 2/20 lymph nodes positive for malignancy. The patient underwent re-irradiation of the right neck and submandibular node in March 2014 with Dr. Pablo Ledger. She underwent 59.4 Gy in 33 fractions. The patient had a second relapse in the right retropharyngeal lymph node in July 2014.She was referred to [facility] for fractionated radiosurgery. She was treated with gamma knife extend to the right retropharyngeal lymph node at a dose of 32 Gy in 4 fractions in August 2014. A third relapse occurred in April of 2017 in the left retropharyngeal lymph node. This was treated with gamma knife extend to the left retropharyngeal lymph node at a dose of 32 Gy in 4 fractions. (Dr Katherine Mantle)   A fourth relapse occurred in August 2018 in the right retropharyngeal lymph  node. MRI of the brain on 02/13/17 showed recurrent right lateral retropharyngeal adenopathy with node measuring 1 cm. A 4 mm cystic structure in the left lateral retropharyngeal station which was likely present previously noted. Negative brain metastasis noted. PET on 02/26/17 showed FDG avid lesion in the right parapharyngeal space which has to be considered as suspicious for metastatic disease. Mild asymmetric activity in the left thyroid may reflect a thyroid nodule. Nonavid ovarian lesions for which the left-sided is multilocated. A pelvic ultrasound was advised. CT of the neck on 02/26/17 showed and enlarged, centrally necrotic right lateral lymph node  retropharyngeal lymph node concerning for metastatic adenocarcinoma.The patient presented to Dr. Cordelia Pen at [facility] on 03/03/17. Per her note, she discussed the patient's options of re-radiation, surgical resection, and active surveillance. The patient elected to undergo active surveillance at that time. Patient presented to her OB/GYN, Dr. Harrington Challenger, on 05/07/17 and while we are unable to access her note, the patient reports she did not undergo pelvic ultrasound.  The patient presents today in good spirits and reports feeling well. She reports radiation is more convenient in [place] than in [place]. She presents today to discuss the role of radiation therapy as part of her disease management.  Swallowing issues, if any: No  Weight Changes: Yes, she reported to nursing that she has gained 11 pounds over the past two years. However, she notes her typical weight is 109 lbs and she presents at 115 lbs today.  Pain status: No. She reports very minimal aches in her neck that do not bother her.   Other symptoms: No. Denies headaches, abdominal pain, chest pain, facial numbness,  changes in vision.  Tobacco history, if any: None  ETOH abuse, if any: Social drinker  Prior cancers, if any: No  PREVIOUS RADIATION THERAPY: Yes  January 2011  66 Gy  in 33 fractions tot the right retromolar trigone with Dr. Pablo Ledger February 2014  59.4 Gy in 33 fractions to the right neck and submandibular node with Dr. Pablo Ledger August 2014  32 Gy in 4 fractions to the right  [facility]roph[facility] at [facility] May 2017  32 Gy in 4 fractions to the left retropharyngeal lymph node at [place]:  has a past medical history of Anxiety state, unspecified (06/16/2007), Cancer ([place]), DEPRESSION (11/04/2007), History of radiation therapy (01/2013), History of radiation therapy (11/08/2015), HYPERTENSION (12/11/2008), INSOMNIA (06/16/2007), Malignant neoplasm of maxillary sinus ([place]) (04/19/2009), OSTEOPOROSIS (11/04/2007), Palpitations (07/11/2008), S/P radiation > 12 weeks (05/30/2009-07/17/09), S/P radiation therapy greater than twelve weeks ago (07/04/12-08/18/12), and SHOULDER PAIN, RIGHT (05/29/2008).    PAST SURGICAL HISTORY: Past Surgical History:  Procedure Laterality Date  . CESAREAN SECTION    . LEFT HEART CATH AND CORONARY ANGIOGRAPHY N/A 08/25/2016   Procedure: Left Heart Cath and Coronary Angiography;  Surgeon: Charolette Forward, MD;  Location: [facility];  Service: Cardiovascular;  Laterality: N/A;  . right partial maxillectomy  02/2009  . right selective neck dissection  05/23/2012  . SKIN GRAFT  2010   right maxillectomy    FAMILY HISTORY: family history includes Breast cancer in her sister; Cancer in her father; Heart disease in her mother; Hypertension in her mother.  SOCIAL HISTORY:  reports that  has never smoked. she has never used smokeless tobacco. She reports that she drinks alcohol. She reports that she does not use drugs.  ALLERGIES: Patient has no known allergies.  MEDICATIONS:  Current Outpatient Medications  Medication Sig Dispense Refill  . amLODipine (NORVASC) 5 MG tablet amlodipine 5 mg tablet  TAKE 1 TABLET BY MOUTH EVERY DAY    . B Complex Vitamins (B COMPLEX PO) Take 1 tablet by mouth daily.    .  Cholecalciferol (VITAMIN D-3) 5000 units TABS Take 5,000 Units by mouth daily.    Marland Kitchen LORazepam (ATIVAN) 1 MG tablet TAKE 1 TABLET BY MOUTH EVERY 8 HOURS AS NEEDED FOR UP TO 10 DAYS FOR ANXIETY 30 tablet 0  . Multiple Vitamins-Minerals (MULTIVITAMIN PO) Take 1 tablet by mouth daily.    . Omega-3 Fatty Acids (FISH OIL PO) Take 1 capsule by mouth daily.    Marland Kitchen aspirin EC 81 MG tablet TAKE 1 TABLET EVERY DAY     No current facility-administered medications for this encounter.     REVIEW OF SYSTEMS:  A 10+ POINT REVIEW OF SYSTEMS WAS OBTAINED including neurology, dermatology, psychiatry, cardiac, respiratory, lymph, extremities, GI, GU, Musculoskeletal, constitutional, breasts,  HEENT.  All pertinent positives are noted in the HPI.  All others are negative.   PHYSICAL EXAM:  height is 5' 3.5" (1.613 m) and weight is 115 lb 12.8 oz (52.5 kg). Her temperature is 98.3 F (36.8 C). Her blood pressure is 138/88 and her pulse is 82. Her oxygen saturation is 100%.   General: Alert and oriented, in no acute distress  HEENT: Head is normocephalic. Extraocular movements are intact. Oropharynx is clear. Mouth is dry. Tongue deviates slightly to the right. Defect in the right lateral palate into the maxillary sinus.  Soft tissue growth within the sinus that appears benign. Neck: Neck is notable for no palpable masses. Heart: Regular in rate and rhythm  with no murmurs, rubs, or gallops. Chest: Clear to auscultation bilaterally, with no rhonchi, wheezes, or rales. Abdomen: Soft, nontender, nondistended, with no rigidity or guarding. Extremities: No cyanosis or edema. Lymphatics: see Neck Exam Skin: No concerning lesions. Musculoskeletal: symmetric strength and muscle tone throughout. Neurologic: Cranial nerves II through XII are grossly intact. No obvious focalities. Speech is fluent. Coordination is intact. Psychiatric: Judgment and insight are intact. Affect is appropriate.   ECOG = 0  0 - Asymptomatic  (Fully active, able to carry on all predisease activities without restriction)  1 - Symptomatic but completely ambulatory (Restricted in physically strenuous activity but ambulatory and able to carry out work of a light or sedentary nature. For example, light housework, office work)  2 - Symptomatic, <50% in bed during the day (Ambulatory and capable of all self care but unable to carry out any work activities. Up and about more than 50% of waking hours)  3 - Symptomatic, >50% in bed, but not bedbound (Capable of only limited self-care, confined to bed or chair 50% or more of waking hours)  4 - Bedbound (Completely disabled. Cannot carry on any self-care. Totally confined to bed or chair)  5 - Death   Eustace Pen MM, Creech RH, Tormey DC, et al. [phone]). "Toxicity and response criteria of the [facility] Group". [place] Oncol. 5 (6): 649-55   LABORATORY DATA:  Lab Results  Component Value Date   WBC 6.2 05/07/2017   HGB 12.6 05/07/2017   HCT 40.1 05/07/2017   MCV 72.7 (L) 05/07/2017   PLT 232.0 05/07/2017   CMP     Component Value Date/Time   NA 140 05/07/2017 1357   K 4.3 05/07/2017 1357   CL 101 05/07/2017 1357   CO2 31 05/07/2017 1357   GLUCOSE 114 (H) 05/07/2017 1357   GLUCOSE 110 (H) 04/14/2006 1035   BUN 17 05/07/2017 1357   BUN 10.0 06/08/2012 1118   CREATININE 0.95 05/07/2017 1357   CREATININE 0.8 06/08/2012 1118   CALCIUM 10.0 05/07/2017 1357   PROT 7.0 05/07/2017 1357   ALBUMIN 4.2 05/07/2017 1357   AST 17 05/07/2017 1357   ALT 15 05/07/2017 1357   ALKPHOS 67 05/07/2017 1357   BILITOT 0.4 05/07/2017 1357   GFRNONAA >60 07/03/2016 0931   GFRAA >60 07/03/2016 0931         RADIOGRAPHY: Mr Outside Films Head/face  Result Date: 06/30/2017 This examination belongs to an outside facility and is stored here for comparison purposes only.  Contact the originating outside institution for any associated report or interpretation.  Ct Outside Films Soft  Tissue Neck  Result Date: 06/30/2017 This examination belongs to an outside facility and is stored here for comparison purposes only.  Contact the originating outside institution for any associated report or interpretation.     IMPRESSION/PLAN: This is a delightful patient with multiply recurrent relatively slow growing head and neck cancer. The patient will proceed with brain MRI tomorrow at 8:00 as ordered by Dr Vallarie Mare. The patient's case will be discussed at tumor board conference next Wednesday. Following conference, I will relay the proposed treatment plan (or observation plan) to the patient and discuss how she would like to proceed with treatment (or not). I advised the patient continue to follow-up with Dr. Nicolette Bang. She will call him to arrange as she missed an appt with him.  I will also try to reach Dr. Katherine Mantle to discuss her case as he's been involved in her salvage [place]  SRS.  Right now, she is leaning towards cont'd surveillance though she is interested in the MRI results.  Addendum: MRI results show stability of node compared to her imaging last August.  I discussed this with DR. Katherine Mantle. He and I concur that observation is appropriate per the patient's wishes.  He will see her for rad/onc followup in 67mo after another MRI.  He can see her the same day as Dr Nicolette Bang to minimize travel.  I asked her to call Gayleen Orem, RN, our Head and Neck Oncology Navigator to discuss if she prefers her MRI here or [facility].  I asked Gayleen Orem, RN, our Head and Neck Oncology Navigator to square aware her scheduling w. Vallarie Mare and [place] 3 days or so after the MRI.  I will see her PRN (Dr. Vallarie Mare will refer her back if EBRT is indicated; if SRS is indicated again he may do so at [facility].) __________________________________________   Eppie Gibson, MD   This document serves as a record of services personally performed by Eppie Gibson, MD. It was created on her behalf by Bethann Humble, a trained medical  scribe. The creation of this record is based on the scribe's personal observations and the provider's statements to them. This document has been checked and approved by the attending provider.

## 2017-07-07 ENCOUNTER — Encounter: Payer: Self-pay | Admitting: Radiation Oncology

## 2017-07-07 ENCOUNTER — Ambulatory Visit
Admission: RE | Admit: 2017-07-07 | Discharge: 2017-07-07 | Disposition: A | Discharge status: Home / Self Care | Payer: Medicare Other | Source: Ambulatory Visit | Attending: Radiology | Admitting: Radiology

## 2017-07-07 DIAGNOSIS — C770 Secondary and unspecified malignant neoplasm of lymph nodes of head, face and neck: Secondary | ICD-10-CM

## 2017-07-07 DIAGNOSIS — G9389 Other specified disorders of brain: Secondary | ICD-10-CM | POA: Diagnosis not present

## 2017-07-07 MED ORDER — GADOBENATE DIMEGLUMINE 529 MG/ML IV SOLN
10.0000 mL | Freq: Once | INTRAVENOUS | Status: AC | PRN
  Administered 2017-07-07: 10 mL via INTRAVENOUS

## 2017-07-09 ENCOUNTER — Telehealth: Payer: Self-pay | Admitting: *Deleted

## 2017-07-09 NOTE — Addendum Note (Signed)
Encounter addended by: Eppie Gibson, MD on: 07/09/2017 1:07 PM  Actions taken: Sign clinical note

## 2017-07-09 NOTE — Telephone Encounter (Addendum)
Oncology Nurse Navigator Documentation  Returned Ms. Bommarito's call.   Per Dr. Isidore Moos, I informed her [facility] MRI indicates R lateral retropharyngeal node is stable, that Dr. Isidore Moos and Dr. Vallarie Mare [facility] discussed result and agree surveillance is appropriate with repeat MRI in 6 months.   She indicated preference MRI be conducted in [place].   She voiced understanding she will have follow-up with Dr. Vallarie Mare and Nicolette Bang in 6 months s/p MRI, that should they determine RT needed at later date, they will refer her back to [place].  Dr. Isidore Moos informed of MRI decision.  Gayleen Orem, RN, BSN Head & Neck Oncology Nurse [place] at [place] [phone]

## 2017-07-12 ENCOUNTER — Other Ambulatory Visit: Payer: Self-pay | Admitting: *Deleted

## 2017-07-12 DIAGNOSIS — C319 Malignant neoplasm of accessory sinus, unspecified: Secondary | ICD-10-CM

## 2017-07-21 ENCOUNTER — Ambulatory Visit: Payer: Self-pay | Admitting: *Deleted

## 2017-07-21 NOTE — Telephone Encounter (Signed)
Attempted to call patient back- left message

## 2017-07-26 ENCOUNTER — Other Ambulatory Visit: Payer: Self-pay | Admitting: *Deleted

## 2017-07-26 DIAGNOSIS — C310 Malignant neoplasm of maxillary sinus: Secondary | ICD-10-CM

## 2017-07-28 ENCOUNTER — Telehealth: Payer: Self-pay | Admitting: Internal Medicine

## 2017-07-28 NOTE — Telephone Encounter (Signed)
Copied from [place] [phone]. Topic: Quick Communication - See Telephone Encounter >> Jul 28, 2017 12:14 PM Percell Belt A wrote: CRM for notification. See Telephone encounter for: pt called in said that she is still having leg craps or burning in lower calf.  She said b12 came back normal so she would like to know what she needs to do next.  She wanted me to send message before she made appt .  She would like  call back from Dr Raliegh Ip nurse    07/28/17.

## 2017-07-29 ENCOUNTER — Telehealth: Payer: Self-pay | Admitting: Internal Medicine

## 2017-07-29 NOTE — Telephone Encounter (Signed)
Copied from [place]. Topic: Quick Communication - Rx Refill/Question >> Jul 29, 2017 11:21 AM Cecelia Byars, NT wrote: Medication:Lorazepam 1 mg Has the patient contacted their pharmacy? yes  (Agent: If no, request that the patient contact the pharmacy for the refill. Preferred Pharmacy (with phone number or street name): [facility]  on [address]  [phone] Agent: Please be advised that RX refills may take up to 3 business days. We ask that you follow-up with your pharmacy.

## 2017-07-29 NOTE — Telephone Encounter (Signed)
Requesting refill of Ativan  LOV 05/07/17 with Dr. Sarajane Jews  [facility] 05/13/17 #30  0 refills

## 2017-07-30 NOTE — Telephone Encounter (Signed)
Spoke with pt , OV made to be assessed by Dr Raliegh Ip.

## 2017-08-02 DIAGNOSIS — K219 Gastro-esophageal reflux disease without esophagitis: Secondary | ICD-10-CM | POA: Diagnosis not present

## 2017-08-02 DIAGNOSIS — I201 Angina pectoris with documented spasm: Secondary | ICD-10-CM | POA: Diagnosis not present

## 2017-08-02 DIAGNOSIS — Z8249 Family history of ischemic heart disease and other diseases of the circulatory system: Secondary | ICD-10-CM | POA: Diagnosis not present

## 2017-08-02 DIAGNOSIS — E785 Hyperlipidemia, unspecified: Secondary | ICD-10-CM | POA: Diagnosis not present

## 2017-08-02 DIAGNOSIS — I10 Essential (primary) hypertension: Secondary | ICD-10-CM | POA: Diagnosis not present

## 2017-08-03 NOTE — Telephone Encounter (Signed)
Dr. Sherren Mocha, okay to refill Ativan x1 w/ same quantity and sig?

## 2017-08-03 NOTE — Telephone Encounter (Signed)
Pt following up on refill request. Pt has appt on 2/11, but doesn't understand why this cannot be filled since this is a low dose- per pt. Advised pt Dr Raliegh Ip is out, and pt does need appt for refills. Pt states she is caring for her elderly mother and it can be stressful at times. Hoping to get enough to get her through ro her appt.

## 2017-08-05 DIAGNOSIS — R7982 Elevated C-reactive protein (CRP): Secondary | ICD-10-CM | POA: Diagnosis not present

## 2017-08-05 DIAGNOSIS — E785 Hyperlipidemia, unspecified: Secondary | ICD-10-CM | POA: Diagnosis not present

## 2017-08-09 NOTE — Telephone Encounter (Signed)
Okay 

## 2017-08-10 ENCOUNTER — Other Ambulatory Visit: Payer: Self-pay | Admitting: Internal Medicine

## 2017-08-10 ENCOUNTER — Encounter: Payer: Self-pay | Admitting: Internal Medicine

## 2017-08-10 ENCOUNTER — Ambulatory Visit (INDEPENDENT_AMBULATORY_CARE_PROVIDER_SITE_OTHER): Payer: Medicare Other | Admitting: Internal Medicine

## 2017-08-10 VITALS — BP 120/68 | HR 83 | Temp 98.5°F | Ht 63.5 in | Wt 114.8 lb

## 2017-08-10 DIAGNOSIS — M79606 Pain in leg, unspecified: Secondary | ICD-10-CM

## 2017-08-10 DIAGNOSIS — I10 Essential (primary) hypertension: Secondary | ICD-10-CM

## 2017-08-10 DIAGNOSIS — F4322 Adjustment disorder with anxiety: Secondary | ICD-10-CM

## 2017-08-10 MED ORDER — LORAZEPAM 1 MG PO TABS: ORAL_TABLET | ORAL | 0 refills | Status: DC

## 2017-08-10 NOTE — Progress Notes (Signed)
Subjective:    Patient ID: Brandy Ramos, female    DOB: [birth year], 67 y.o.   MRN: [id number]  HPI  67 year old patient who has a history of essential hypertension.  She also has a history of adjustment disorder with anxious mood.  Medical regimen does include as needed lorazepam. For the past 3 months she has had some bilateral leg pain.  Left seems more involved in the right.  She describes discomfort in a crampy sensation involving primarily the left calf but also seems to involve the knee.  Symptoms will occasionally occur on the right.  Pain is not aggravated by walking.  She states pain is aggravated by stress when she becomes angry and also aggravated by vivid dreams.  No exertional symptoms  Past Medical History:  Diagnosis Date  . Anxiety state, unspecified 06/16/2007  . 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 DEPRESSION 11/04/2007  . History of radiation therapy 01/2013   Gamma Knife extend 32 Gy in 4 fractions to a right retropharyngeal lymph node  . History of radiation therapy 11/08/2015   GK extend 32 gy in 4 fractions to Left Retropharyngeal LN  . HYPERTENSION 12/11/2008  . INSOMNIA 06/16/2007  . Malignant neoplasm of maxillary sinus ([place]) 04/19/2009  . OSTEOPOROSIS 11/04/2007   S/P 5 years of Fosamax therapy  . Palpitations 07/11/2008  . S/P radiation > 12 weeks 05/30/2009-07/17/09   completed radiation therapy January 2011  . S/P radiation therapy greater than twelve weeks ago 07/04/12-08/18/12   r neck& draining lymph nodes,59.4Gy/322fx  . SHOULDER PAIN, RIGHT 05/29/2008     Social History   Socioeconomic History  . Marital status: Married    Spouse name: Not on file  . Number of children: Not on file  . Years of education: Not on file  . Highest education level: Not on file  Social Needs  . Financial resource strain: Not on file  . Food insecurity - worry:  Not on file  . Food insecurity - inability: Not on file  . Transportation needs - medical: Not on file  . Transportation needs - non-medical: Not on file  Occupational History  . Not on file  Tobacco Use  . Smoking status: Never Smoker  . Smokeless tobacco: Never Used  Substance and Sexual Activity  . Alcohol use: Yes    Comment: very rare glass of wine, occ'l beer  . Drug use: No  . Sexual activity: Not on file  Other Topics Concern  . Not on file  Social History Narrative   The patient is married and has 3 children.   The patient is a nonsmoker.  Patient has never used smokeless tobacco products.   The patient with rare use of alcohol, drinking wine or beer.   The patient's mother is alive at age is 79 with a history of heart disease, multiple stents, and hypertension.   The father passed away at the age of 50 from Colon Cancer    Past Surgical History:  Procedure Laterality Date  . CESAREAN SECTION    . LEFT HEART CATH AND CORONARY ANGIOGRAPHY N/A 08/25/2016   Procedure: Left Heart Cath and Coronary Angiography;  Surgeon: Charolette Forward, MD;  Location: [facility];  Service: Cardiovascular;  Laterality: N/A;  . right partial maxillectomy  02/2009  . right selective neck dissection  05/23/2012  . SKIN GRAFT  2010   right maxillectomy  Family History  Problem Relation Age of Onset  . Heart disease Mother   . Hypertension Mother   . Cancer Father   . Breast cancer Sister     No Known Allergies  Current Outpatient Medications on File Prior to Visit  Medication Sig Dispense Refill  . amLODipine (NORVASC) 5 MG tablet amlodipine 5 mg tablet  TAKE 1 TABLET BY MOUTH EVERY DAY    . aspirin EC 81 MG tablet TAKE 1 TABLET EVERY DAY    . B Complex Vitamins (B COMPLEX PO) Take 1 tablet by mouth daily.    . Cholecalciferol (VITAMIN D-3) 5000 units TABS Take 5,000 Units by mouth daily.    . Multiple Vitamins-Minerals (MULTIVITAMIN PO) Take 1 tablet by mouth daily.    .  Omega-3 Fatty Acids (FISH OIL PO) Take 1 capsule by mouth daily.     No current facility-administered medications on file prior to visit.     BP 120/68 (BP Location: Right Arm, Patient Position: Sitting, Cuff Size: Normal)   Pulse 83   Temp 98.5 F (36.9 C) (Oral)   Ht 5' 3.5" (1.613 m)   Wt 114 lb 12.8 oz (52.1 kg)   SpO2 98%   BMI 20.02 kg/m     Review of Systems  Constitutional: Negative.   HENT: Negative for congestion, dental problem, hearing loss, rhinorrhea, sinus pressure, sore throat and tinnitus.   Eyes: Negative for pain, discharge and visual disturbance.  Respiratory: Negative for cough and shortness of breath.   Cardiovascular: Negative for chest pain, palpitations and leg swelling.  Gastrointestinal: Negative for abdominal distention, abdominal pain, blood in stool, constipation, diarrhea, nausea and vomiting.  Genitourinary: Negative for difficulty urinating, dysuria, flank pain, frequency, hematuria, pelvic pain, urgency, vaginal bleeding, vaginal discharge and vaginal pain.  Musculoskeletal: Negative for arthralgias, gait problem and joint swelling.       Bilateral calf pain left greater than right  Skin: Negative for rash.  Neurological: Negative for dizziness, syncope, speech difficulty, weakness, numbness and headaches.  Hematological: Negative for adenopathy.  Psychiatric/Behavioral: Negative for agitation, behavioral problems and dysphoric mood. The patient is not nervous/anxious.        Objective:   Physical Exam  Constitutional: She appears well-developed and well-nourished. No distress.  Blood pressure well controlled  Cardiovascular: Intact distal pulses.  Musculoskeletal:  Examination of the legs revealed no abnormalities.  Feet are well perfused with full pedal pulses.  No calf tenderness or swelling No knee pain or inflammatory changes            Assessment & Plan:  Nonspecific bilateral leg pain  Situational stress Essential  hypertension well-controlled  The patient will report any new or worsening symptoms.  Symptoms do not seem aggravated by activities and she was encouraged to participate at her usual gym activities Continue Advil as needed  Nyoka Cowden

## 2017-08-10 NOTE — Telephone Encounter (Signed)
Rx faxed to the pharmacy.

## 2017-08-10 NOTE — Telephone Encounter (Signed)
Rx printed, awaiting to be singed by MD.

## 2017-10-27 ENCOUNTER — Other Ambulatory Visit: Payer: Self-pay | Admitting: Internal Medicine

## 2017-11-01 ENCOUNTER — Telehealth: Payer: Self-pay | Admitting: *Deleted

## 2017-11-01 NOTE — Telephone Encounter (Signed)
Copied from [place] [id number]. Topic: General - Other >> Nov 01, 2017  1:33 PM  Stare wrote:  Pt is following up on her refill showing RX was printed but pharmacy do not have   LORazepam (ATIVAN) 1 MG tablet  [facility] [place] [facility] [place]

## 2017-11-01 NOTE — Telephone Encounter (Signed)
Spoke to pharmacy and they stated that it has been filled since 10/27/2017. Called patient and informed her of update. Patient verbalized understanding.

## 2017-11-09 DIAGNOSIS — H10413 Chronic giant papillary conjunctivitis, bilateral: Secondary | ICD-10-CM | POA: Diagnosis not present

## 2017-11-09 DIAGNOSIS — H11133 Conjunctival pigmentations, bilateral: Secondary | ICD-10-CM | POA: Diagnosis not present

## 2017-11-09 DIAGNOSIS — H40013 Open angle with borderline findings, low risk, bilateral: Secondary | ICD-10-CM | POA: Diagnosis not present

## 2017-11-09 DIAGNOSIS — H2513 Age-related nuclear cataract, bilateral: Secondary | ICD-10-CM | POA: Diagnosis not present

## 2017-11-17 ENCOUNTER — Telehealth: Payer: Self-pay | Admitting: *Deleted

## 2017-11-17 NOTE — Telephone Encounter (Signed)
A user error has taken place: encounter opened in error, closed for administrative reasons.

## 2017-12-02 ENCOUNTER — Telehealth: Payer: Self-pay | Admitting: *Deleted

## 2017-12-02 ENCOUNTER — Other Ambulatory Visit: Payer: Self-pay | Admitting: *Deleted

## 2017-12-02 DIAGNOSIS — C310 Malignant neoplasm of maxillary sinus: Secondary | ICD-10-CM

## 2017-12-02 NOTE — Telephone Encounter (Signed)
Oncology Nurse Navigator Documentation  Spoke with Brandy Ramos, reminded her 24-month post-tmt MRI to be scheduled for later this month after which appt with Drs. Waltonen and Vallarie Mare [facility] will be scheduled.  She verbalized understanding, expressed appreciation for call.  Gayleen Orem, RN, BSN Head & Neck Oncology Nurse [place] at [place] [phone]

## 2017-12-20 ENCOUNTER — Telehealth: Payer: Self-pay | Admitting: Internal Medicine

## 2017-12-20 NOTE — Telephone Encounter (Signed)
Copied from [place] [id number]. Topic: Quick Communication - Rx Refill/Question >> Dec 20, 2017  2:26 PM Burchel, Abbi R wrote: Medication: LORazepam (ATIVAN) 1 MG tablet  Has the patient contacted their pharmacy? N/A   [id number] [place], [place]  [phone] (Phone) [phone] (Fax)  Pt states she would like to change Lorazepam rx to something else.  Pt states she needs an SSR instead.    Call Back: [phone]  Agent: Please be advised that RX refills may take up to 3 business days. We ask that you follow-up with your pharmacy.

## 2017-12-21 NOTE — Telephone Encounter (Signed)
Please advise 

## 2017-12-21 NOTE — Telephone Encounter (Signed)
Generic Lexapro 5 mg number 61 tablet daily in the morning; suggest return office visit in 6 weeks

## 2017-12-22 DIAGNOSIS — I201 Angina pectoris with documented spasm: Secondary | ICD-10-CM | POA: Diagnosis not present

## 2017-12-22 DIAGNOSIS — K219 Gastro-esophageal reflux disease without esophagitis: Secondary | ICD-10-CM | POA: Diagnosis not present

## 2017-12-22 DIAGNOSIS — I10 Essential (primary) hypertension: Secondary | ICD-10-CM | POA: Diagnosis not present

## 2017-12-22 DIAGNOSIS — Z8249 Family history of ischemic heart disease and other diseases of the circulatory system: Secondary | ICD-10-CM | POA: Diagnosis not present

## 2017-12-22 DIAGNOSIS — E785 Hyperlipidemia, unspecified: Secondary | ICD-10-CM | POA: Diagnosis not present

## 2017-12-22 MED ORDER — ESCITALOPRAM OXALATE 5 MG PO TABS: 5.0000 mg | ORAL_TABLET | Freq: Every day | ORAL | 0 refills | Status: DC

## 2017-12-22 NOTE — Telephone Encounter (Signed)
Rx did not print out will call pharmacy to phone in Rx.

## 2017-12-23 NOTE — Telephone Encounter (Signed)
Rx sent electronically. Will call patient to arrange 6 week appointment when phones are back up.

## 2017-12-23 NOTE — Telephone Encounter (Signed)
Left detailed message informing pt of update. 

## 2017-12-24 ENCOUNTER — Other Ambulatory Visit: Payer: Medicare Other

## 2017-12-28 ENCOUNTER — Telehealth: Payer: Self-pay | Admitting: *Deleted

## 2017-12-28 NOTE — Telephone Encounter (Signed)
Oncology Nurse Navigator Documentation  Rec'd e-mail reply from Dr. Servando Salina secretary Vickie confirming Ms. Volcy's 7/30 appts to see Drs. Waltonen and [employer].   Gayleen Orem, RN, BSN Head & Neck Oncology Nurse [place] at [place] [phone]

## 2017-12-31 ENCOUNTER — Ambulatory Visit
Admission: RE | Admit: 2017-12-31 | Discharge: 2017-12-31 | Disposition: A | Discharge status: Home / Self Care | Payer: Medicare Other | Source: Ambulatory Visit | Attending: Radiation Oncology | Admitting: Radiation Oncology

## 2017-12-31 DIAGNOSIS — J320 Chronic maxillary sinusitis: Secondary | ICD-10-CM | POA: Diagnosis not present

## 2017-12-31 DIAGNOSIS — C310 Malignant neoplasm of maxillary sinus: Secondary | ICD-10-CM

## 2017-12-31 MED ORDER — GADOBENATE DIMEGLUMINE 529 MG/ML IV SOLN
10.0000 mL | Freq: Once | INTRAVENOUS | Status: AC | PRN
  Administered 2017-12-31: 10 mL via INTRAVENOUS

## 2018-01-06 ENCOUNTER — Telehealth: Payer: Self-pay | Admitting: Internal Medicine

## 2018-01-06 NOTE — Telephone Encounter (Signed)
Please advise 

## 2018-01-06 NOTE — Telephone Encounter (Signed)
Copied from [place] [phone]. Topic: General - Other >> Jan 06, 2018  9:20 AM Brandy Ramos wrote: Pt called in stating escitalopram (LEXAPRO) 5 MG tablet is bothering her eyes is there something else that can be prescribed ?  Callback # [id number]

## 2018-01-10 ENCOUNTER — Other Ambulatory Visit: Payer: Self-pay | Admitting: Internal Medicine

## 2018-01-10 MED ORDER — BUPROPION HCL ER (XL) 150 MG PO TB24: 150.0000 mg | ORAL_TABLET | Freq: Every day | ORAL | 2 refills | Status: DC

## 2018-01-10 NOTE — Telephone Encounter (Signed)
Discontinue Lexapro.  Please make patient aware that there is a new prescription available at her drugstore

## 2018-01-11 ENCOUNTER — Other Ambulatory Visit: Payer: Self-pay

## 2018-01-11 NOTE — Telephone Encounter (Signed)
Left detailed message informing pt of update. 

## 2018-02-09 ENCOUNTER — Telehealth: Payer: Self-pay

## 2018-02-09 NOTE — Telephone Encounter (Signed)
Pt is having issues with her vision and wants Lexapro changed to Celexa. Pt is also due for HTN follow-up this month. I tried to call pt but no answer.  Please advise

## 2018-02-17 ENCOUNTER — Other Ambulatory Visit: Payer: Self-pay

## 2018-02-21 ENCOUNTER — Other Ambulatory Visit: Payer: Self-pay | Admitting: Internal Medicine

## 2019-10-02 DIAGNOSIS — I10 Essential (primary) hypertension: Secondary | ICD-10-CM | POA: Diagnosis not present

## 2019-10-02 DIAGNOSIS — M791 Myalgia, unspecified site: Secondary | ICD-10-CM | POA: Diagnosis not present

## 2019-10-02 DIAGNOSIS — I201 Angina pectoris with documented spasm: Secondary | ICD-10-CM | POA: Diagnosis not present

## 2019-10-02 DIAGNOSIS — E785 Hyperlipidemia, unspecified: Secondary | ICD-10-CM | POA: Diagnosis not present

## 2019-10-02 DIAGNOSIS — K219 Gastro-esophageal reflux disease without esophagitis: Secondary | ICD-10-CM | POA: Diagnosis not present

## 2019-10-17 ENCOUNTER — Ambulatory Visit (INDEPENDENT_AMBULATORY_CARE_PROVIDER_SITE_OTHER): Payer: Medicare Other | Admitting: Nurse Practitioner

## 2019-10-17 ENCOUNTER — Encounter: Payer: Self-pay | Admitting: Nurse Practitioner

## 2019-10-17 ENCOUNTER — Other Ambulatory Visit: Payer: Self-pay

## 2019-10-17 VITALS — BP 130/82 | HR 78 | Temp 97.6°F | Resp 18 | Ht 63.5 in | Wt 113.0 lb

## 2019-10-17 DIAGNOSIS — Z0000 Encounter for general adult medical examination without abnormal findings: Secondary | ICD-10-CM

## 2019-10-17 DIAGNOSIS — I10 Essential (primary) hypertension: Secondary | ICD-10-CM

## 2019-10-17 DIAGNOSIS — E7800 Pure hypercholesterolemia, unspecified: Secondary | ICD-10-CM | POA: Diagnosis not present

## 2019-10-17 DIAGNOSIS — M25511 Pain in right shoulder: Secondary | ICD-10-CM | POA: Diagnosis not present

## 2019-10-17 DIAGNOSIS — C770 Secondary and unspecified malignant neoplasm of lymph nodes of head, face and neck: Secondary | ICD-10-CM | POA: Insufficient documentation

## 2019-10-17 DIAGNOSIS — Z7689 Persons encountering health services in other specified circumstances: Secondary | ICD-10-CM

## 2019-10-17 DIAGNOSIS — Z0001 Encounter for general adult medical examination with abnormal findings: Secondary | ICD-10-CM | POA: Diagnosis not present

## 2019-10-17 DIAGNOSIS — M25512 Pain in left shoulder: Secondary | ICD-10-CM

## 2019-10-17 NOTE — Patient Instructions (Addendum)
You have established care today Labs where complete by your previous provide a month ago and you stated Atorvastatin for elevated cholesterol.  Follow up in 3 months as you are interested in results after initiating medication, exercise, and diet tx.  Mild shoulder discomfort you sought care outside of this clinic for management our plan is to follow up at your next visit as you desired to consider if this has been chronic, worsened since atorvastatin, or new since atorvastatin.  You should consider COVID, Prevnar 13, Tdap, Shingrex vaccination as you declined today.  You are utd on mammogram 07/2019 and PAP per pt. Your Cscope may be do you would like to reach out to your GI to discuss.  Blood pressure should maintain 140/90 or less at 3-4 hours after taking your morning medications.   Have a great day!

## 2019-10-17 NOTE — Progress Notes (Signed)
New Patient Office Visit  Subjective:  Patient ID: Brandy Ramos, female    DOB: [birth year]  Age: 69 y.o. MRN: [id number]  CC:  Chief Complaint  Patient presents with  . Establish Care    NP    HPI Brandy Ramos is a 69 year old female presenting to establish care. She reports one month ago having labs and had elevated cholesterol starting Atorvastatin. She has only a h/o HNT and osteoporoses. She recently started having bilateral shoulder pain which she has seen her previous provider for was told she has osteo vs bursitis. She will consider timeline of this sxs in relation to starting atorvastatin. Reviewed health maintainence she feels in good health getting exercise and eating healthy. Will plan to follow up in 3 months as pt desires to have lipid panel repeated to evaluate lifestyle and medication effects.  No cp/ct, gu.gi sxs, pain , sob, edema, or recent falls.  Past Medical History:  Diagnosis Date  . Anxiety state, unspecified 06/16/2007  . 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 DEPRESSION 11/04/2007  . History of radiation therapy 01/2013   Gamma Knife extend 32 Gy in 4 fractions to a right retropharyngeal lymph node  . History of radiation therapy 11/08/2015   GK extend 32 gy in 4 fractions to Left Retropharyngeal LN  . HYPERTENSION 12/11/2008  . INSOMNIA 06/16/2007  . 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 OSTEOPOROSIS 11/04/2007   S/P 5 years of Fosamax therapy  . Palpitations 07/11/2008  . S/P radiation > 12 weeks 05/30/2009-07/17/09   completed radiation therapy January 2011  . S/P radiation therapy greater than twelve weeks ago 07/04/12-08/18/12   r neck& draining lymph nodes,59.4Gy/365fx  . SHOULDER PAIN, RIGHT 05/29/2008    Past Surgical History:  Procedure Laterality Date  . CESAREAN SECTION    . LEFT HEART CATH AND CORONARY ANGIOGRAPHY  N/A 08/25/2016   Procedure: Left Heart Cath and Coronary Angiography;  Surgeon: Charolette Forward, MD;  Location: [place] [facility];  Service: Cardiovascular;  Laterality: N/A;  . right partial maxillectomy  02/2009  . right selective neck dissection  05/23/2012  . SKIN GRAFT  2010   right maxillectomy    Family History  Problem Relation Age of Onset  . Heart disease Mother   . Hypertension Mother   . Cancer Father   . Breast cancer Sister     Social History   Socioeconomic History  . Marital status: Married    Spouse name: Not on file  . Number of children: Not on file  . Years of education: Not on file  . Highest education level: Not on file  Occupational History  . Not on file  Tobacco Use  . Smoking status: Never Smoker  . Smokeless tobacco: Never Used  Substance and Sexual Activity  . Alcohol use: Yes    Comment: very rare glass of wine, occ'l beer  . Drug use: No  . Sexual activity: Not Currently  Other Topics Concern  . Not on file  Social History Narrative   The patient is married and has 3 children.   The patient is a nonsmoker.  Patient has never used smokeless tobacco products.   The patient with rare use of alcohol, drinking wine or beer.   The patient's mother is alive at age is 2 with a history of heart disease, multiple stents, and hypertension.  The father passed away at the age of 30 from Colon Cancer   Social Determinants of Health   Financial Resource Strain:   . Difficulty of Paying Living Expenses:   Food Insecurity:   . Worried About Charity fundraiser in the Last Year:   . Arboriculturist in the Last Year:   Transportation Needs:   . Film/video editor (Medical):   Marland Kitchen Lack of Transportation (Non-Medical):   Physical Activity:   . Days of Exercise per Week:   . Minutes of Exercise per Session:   Stress:   . Feeling of Stress :   Social Connections:   . Frequency of Communication with Friends and Family:   . Frequency of Social  Gatherings with Friends and Family:   . Attends Religious Services:   . Active Member of Clubs or Organizations:   . Attends Archivist Meetings:   Marland Kitchen Marital Status:   Intimate Partner Violence:   . Fear of Current or Ex-Partner:   . Emotionally Abused:   Marland Kitchen Physically Abused:   . Sexually Abused:     ROS Review of Systems  All other systems reviewed and are negative.   Objective:   Today's Vitals: BP 130/82 (BP Location: Left Arm, Patient Position: Sitting, Cuff Size: Normal)   Pulse 78   Temp 97.6 F (36.4 C) (Temporal)   Resp 18   Ht 5' 3.5" (1.613 m)   Wt 113 lb (51.3 kg)   SpO2 96%   BMI 19.70 kg/m   Physical Exam Vitals and nursing note reviewed.  Constitutional:      Appearance: Normal appearance. She is not ill-appearing.  HENT:     Head: Normocephalic.     Right Ear: External ear normal.     Left Ear: External ear normal.     Mouth/Throat:     Mouth: Mucous membranes are moist.     Pharynx: Oropharynx is clear.  Eyes:     General: No scleral icterus.    Conjunctiva/sclera: Conjunctivae normal.     Pupils: Pupils are equal, round, and reactive to light.  Neck:     Thyroid: No thyromegaly.     Vascular: No carotid bruit or JVD.  Cardiovascular:     Rate and Rhythm: Normal rate and regular rhythm.     Pulses: Normal pulses.     Heart sounds: Normal heart sounds, S1 normal and S2 normal.  Pulmonary:     Effort: Pulmonary effort is normal.     Breath sounds: Normal breath sounds.  Abdominal:     General: Abdomen is flat. Bowel sounds are normal. There is no abdominal bruit.     Palpations: Abdomen is soft.  Musculoskeletal:        General: Normal range of motion.     Cervical back: Normal range of motion and neck supple.     Right lower leg: No edema.     Left lower leg: No edema.  Lymphadenopathy:     Cervical: No cervical adenopathy.  Skin:    General: Skin is warm and dry.     Capillary Refill: Capillary refill takes less than 2  seconds.  Neurological:     General: No focal deficit present.     Mental Status: She is alert and oriented to person, place, and time.  Psychiatric:        Mood and Affect: Mood normal.        Behavior: Behavior normal.  Thought Content: Thought content normal.     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  Follow up in 3 months as you are interested in results after initiating medication, exercise, and diet tx.  Mild shoulder discomfort you sought care outside of this clinic for management our plan is to follow up at your next visit as you desired to consider if this has been chronic, worsened since atorvastatin, or new since atorvastatin.  You should consider COVID, Prevnar 13, Tdap, Shingrex vaccination as you declined today.  You are utd on mammogram 07/2019 and PAP per pt. Your Cscope may be do you would like to reach out to your GI to discuss.  Blood pressure should maintain 140/90 or less at 3-4 hours after taking your morning medications.    Follow-up: Return in about 3 months (around 01/16/2020) for lab one week prior.   Annie Main, FNP

## 2019-11-10 DIAGNOSIS — H2513 Age-related nuclear cataract, bilateral: Secondary | ICD-10-CM | POA: Diagnosis not present

## 2019-11-10 DIAGNOSIS — H40013 Open angle with borderline findings, low risk, bilateral: Secondary | ICD-10-CM | POA: Diagnosis not present

## 2019-11-10 DIAGNOSIS — H10413 Chronic giant papillary conjunctivitis, bilateral: Secondary | ICD-10-CM | POA: Diagnosis not present

## 2020-01-08 DIAGNOSIS — I201 Angina pectoris with documented spasm: Secondary | ICD-10-CM | POA: Diagnosis not present

## 2020-01-08 DIAGNOSIS — I10 Essential (primary) hypertension: Secondary | ICD-10-CM | POA: Diagnosis not present

## 2020-01-08 DIAGNOSIS — E785 Hyperlipidemia, unspecified: Secondary | ICD-10-CM | POA: Diagnosis not present

## 2020-01-16 ENCOUNTER — Other Ambulatory Visit: Payer: Self-pay

## 2020-01-16 ENCOUNTER — Ambulatory Visit (INDEPENDENT_AMBULATORY_CARE_PROVIDER_SITE_OTHER): Payer: Medicare Other | Admitting: Nurse Practitioner

## 2020-01-16 VITALS — BP 130/66 | HR 78 | Temp 97.6°F | Resp 18 | Wt 115.6 lb

## 2020-01-16 DIAGNOSIS — Z0000 Encounter for general adult medical examination without abnormal findings: Secondary | ICD-10-CM | POA: Diagnosis not present

## 2020-01-16 DIAGNOSIS — Z0001 Encounter for general adult medical examination with abnormal findings: Secondary | ICD-10-CM

## 2020-01-16 DIAGNOSIS — R7309 Other abnormal glucose: Secondary | ICD-10-CM | POA: Diagnosis not present

## 2020-01-16 DIAGNOSIS — I10 Essential (primary) hypertension: Secondary | ICD-10-CM

## 2020-01-16 DIAGNOSIS — E7800 Pure hypercholesterolemia, unspecified: Secondary | ICD-10-CM

## 2020-01-16 NOTE — Patient Instructions (Signed)
Labs where complete today and you will be contacted by clinic with abnormal results and directions.  Follow up in 6 months.  Continue medication, exercise, and diet tx.  You should consider COVID, Prevnar 13, Tdap, Shingrex vaccination as you declined today.  Your Cscope may be do you would like to reach out to your GI to discuss.  Blood pressure should maintain 140/90 or less at 3-4 hours after taking your morning medications.   Have a great day!

## 2020-01-16 NOTE — Progress Notes (Signed)
Established Patient Office Visit  Subjective:  Patient ID: Brandy Ramos, female    DOB: 04-Sep-1950  Age: 69 y.o. MRN: [id number]  CC:  Chief Complaint  Patient presents with  . Hypertension    follow up    HPI Brandy Ramos is a 69 year old female presenting for a general medical adult examination and follow up for labs and HTN. She established care in 09/2019 when she stated she had labs at a previous pcp 1 month prior although no records received at this clinic. At that time she desired to wait 3 months to complete labs to be able to exercise, diet improvement (lifestyle changes) and she has started a statin medication prescribed by previous provider.   Today her Blood pressure is stable. She will complete labs adding A1C for available prior year labs in epic shows elevated blood glucose.   Last visit the pt was encouraged and considering COVID, Prevnar 13, Td, and Shingrex vaccination. She was planing to contact her GI specialist to schedule a cscope. She is still considering not completed.   She reports she is doing well with her diet and exercise. Including greek plain yogurt for the calcium, vitamin D, low fat and sugar, eating low cholesterol foods.   No cp, ct, gu, gi sxs, pain, sob, edema, palpitations, or falls.  Past Medical History:  Diagnosis Date  . Anxiety state, unspecified 06/16/2007  . 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 DEPRESSION 11/04/2007  . History of radiation therapy 01/2013   Gamma Knife extend 32 Gy in 4 fractions to a right retropharyngeal lymph node  . History of radiation therapy 11/08/2015   GK extend 32 gy in 4 fractions to Left Retropharyngeal LN  . HYPERTENSION 12/11/2008  . INSOMNIA 06/16/2007  . 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 OSTEOPOROSIS 11/04/2007   S/P 5 years of Fosamax therapy  . Palpitations  07/11/2008  . S/P radiation > 12 weeks 05/30/2009-07/17/09   completed radiation therapy January 2011  . S/P radiation therapy greater than twelve weeks ago 07/04/12-08/18/12   r neck& draining lymph nodes,59.4Gy/339fx  . SHOULDER PAIN, RIGHT 05/29/2008    Past Surgical History:  Procedure Laterality Date  . CESAREAN SECTION    . LEFT HEART CATH AND CORONARY ANGIOGRAPHY N/A 08/25/2016   Procedure: Left Heart Cath and Coronary Angiography;  Surgeon: Charolette Forward, MD;  Location: [place] [facility];  Service: Cardiovascular;  Laterality: N/A;  . right partial maxillectomy  02/2009  . right selective neck dissection  05/23/2012  . SKIN GRAFT  2010   right maxillectomy    Family History  Problem Relation Age of Onset  . Heart disease Mother   . Hypertension Mother   . Cancer Father   . Breast cancer Sister     Social History   Socioeconomic History  . Marital status: Married    Spouse name: Not on file  . Number of children: Not on file  . Years of education: Not on file  . Highest education level: Not on file  Occupational History  . Not on file  Tobacco Use  . Smoking status: Never Smoker  . Smokeless tobacco: Never Used  Substance and Sexual Activity  . Alcohol use: Yes    Comment: very rare glass of wine, occ'l beer  . Drug use: No  . Sexual activity: Not Currently  Other Topics  Concern  . Not on file  Social History Narrative   The patient is married and has 3 children.   The patient is a nonsmoker.  Patient has never used smokeless tobacco products.   The patient with rare use of alcohol, drinking wine or beer.   The patient's mother is alive at age is 78 with a history of heart disease, multiple stents, and hypertension.   The father passed away at the age of 20 from Colon Cancer   Social Determinants of Health   Financial Resource Strain:   . Difficulty of Paying Living Expenses:   Food Insecurity:   . Worried About Charity fundraiser in the Last Year:   . Arts development officer in the Last Year:   Transportation Needs:   . Film/video editor (Medical):   Marland Kitchen Lack of Transportation (Non-Medical):   Physical Activity:   . Days of Exercise per Week:   . Minutes of Exercise per Session:   Stress:   . Feeling of Stress :   Social Connections:   . Frequency of Communication with Friends and Family:   . Frequency of Social Gatherings with Friends and Family:   . Attends Religious Services:   . Active Member of Clubs or Organizations:   . Attends Archivist Meetings:   Marland Kitchen Marital Status:   Intimate Partner Violence:   . Fear of Current or Ex-Partner:   . Emotionally Abused:   Marland Kitchen Physically Abused:   . Sexually Abused:     Outpatient Medications Prior to Visit  Medication Sig Dispense Refill  . amLODipine (NORVASC) 5 MG tablet amlodipine 5 mg tablet  TAKE 1 TABLET BY MOUTH EVERY DAY    . aspirin EC 81 MG tablet TAKE 1 TABLET EVERY DAY    . B Complex Vitamins (B COMPLEX PO) Take 1 tablet by mouth daily.    . Cholecalciferol (VITAMIN D-3) 5000 units TABS Take 5,000 Units by mouth daily.    . Multiple Vitamins-Minerals (MULTIVITAMIN PO) Take 1 tablet by mouth daily.    . Omega-3 Fatty Acids (FISH OIL PO) Take 1 capsule by mouth daily.    Marland Kitchen buPROPion (WELLBUTRIN XL) 150 MG 24 hr tablet Take 1 tablet (150 mg total) by mouth daily. 60 tablet 2   No facility-administered medications prior to visit.    No Known Allergies  ROS Review of Systems  All other systems reviewed and are negative.     Objective:    Physical Exam Vitals and nursing note reviewed.  Constitutional:      Appearance: Normal appearance. She is well-developed and well-groomed.  HENT:     Head: Normocephalic.  Eyes:     General: Lids are normal. Lids are everted, no foreign bodies appreciated.     Extraocular Movements: Extraocular movements intact.     Conjunctiva/sclera: Conjunctivae normal.     Pupils: Pupils are equal, round, and reactive to light.    Cardiovascular:     Rate and Rhythm: Normal rate.  Pulmonary:     Effort: Pulmonary effort is normal.  Musculoskeletal:     Cervical back: Normal range of motion and neck supple.     Right lower leg: No edema.     Left lower leg: No edema.  Skin:    General: Skin is dry.  Neurological:     General: No focal deficit present.     Mental Status: She is alert and oriented to person, place, and time.  GCS: GCS eye subscore is 4. GCS verbal subscore is 5. GCS motor subscore is 6.  Psychiatric:        Attention and Perception: Attention and perception normal.        Mood and Affect: Mood and affect normal.        Speech: Speech normal.        Behavior: Behavior normal. Behavior is cooperative.        Thought Content: Thought content normal.        Cognition and Memory: Cognition normal.        Judgment: Judgment normal.     BP 130/66 (BP Location: Left Arm, Patient Position: Sitting, Cuff Size: Normal)   Pulse 78   Temp 97.6 F (36.4 C) (Temporal)   Resp 18   Wt 115 lb 9.6 oz (52.4 kg)   SpO2 98%   BMI 20.16 kg/m  Wt Readings from Last 3 Encounters:  01/16/20 115 lb 9.6 oz (52.4 kg)  10/17/19 113 lb (51.3 kg)  08/10/17 114 lb 12.8 oz (52.1 kg)     Health Maintenance Due  Topic Date Due  . Hepatitis C Screening  Never done  . COVID-19 Vaccine (1) Never done  . PNA vac Low Risk Adult (1 of 2 - PCV13) Never done  . MAMMOGRAM  05/12/2018    There are no preventive care reminders to display for this patient.  Lab Results  Component Value Date   TSH 1.34 05/07/2017   Lab Results  Component Value Date   WBC 6.2 05/07/2017   HGB 12.6 05/07/2017   HCT 40.1 05/07/2017   MCV 72.7 (L) 05/07/2017   PLT 232.0 05/07/2017   Lab Results  Component Value Date   NA 140 05/07/2017   K 4.3 05/07/2017   CO2 31 05/07/2017   GLUCOSE 114 (H) 05/07/2017   BUN 17 05/07/2017   CREATININE 0.95 05/07/2017   BILITOT 0.4 05/07/2017   ALKPHOS 67 05/07/2017   AST 17 05/07/2017    ALT 15 05/07/2017   PROT 7.0 05/07/2017   ALBUMIN 4.2 05/07/2017   CALCIUM 10.0 05/07/2017   GFR 75.37 05/07/2017   Lab Results  Component Value Date   CHOL 219 (H) 10/22/2014   Lab Results  Component Value Date   HDL 75.80 10/22/2014   Lab Results  Component Value Date   LDLCALC 136 (H) 10/22/2014   Lab Results  Component Value Date   TRIG 37.0 10/22/2014   Lab Results  Component Value Date   CHOLHDL 3 10/22/2014   Lab Results  Component Value Date   HGBA1C 5.9 04/14/2006      Assessment & Plan:   Problem List Items Addressed This Visit      Cardiovascular and Mediastinum   Essential hypertension   Relevant Orders   Lipid panel   Hemoglobin A1c   COMPLETE METABOLIC PANEL WITH GFR   CBC with Differential/Platelet     Other   Hypercholesteremia - Primary   Relevant Orders   Lipid panel   Hemoglobin A1c   COMPLETE METABOLIC PANEL WITH GFR   CBC with Differential/Platelet    Other Visit Diagnoses    Elevated glucose       Relevant Orders   Lipid panel   Hemoglobin A1c   COMPLETE METABOLIC PANEL WITH GFR   CBC with Differential/Platelet   General medical exam       Relevant Orders   Lipid panel   Hemoglobin A1c   COMPLETE METABOLIC PANEL WITH GFR  CBC with Differential/Platelet    Labs where complete today and you will be contacted by clinic with abnormal results and directions.  Follow up in 6 months.  Continue medication, exercise, and diet tx.  You should consider COVID, Prevnar 13, Tdap, Shingrex vaccination as you declined today.  Your Cscope may be do you would like to reach out to your GI to discuss.  Blood pressure should maintain 140/90 or less at 3-4 hours after taking your morning medications.   No orders of the defined types were placed in this encounter.   Follow-up: Return in about 6 months (around 07/18/2020).    Annie Main, FNP

## 2020-01-17 LAB — CBC WITH DIFFERENTIAL/PLATELET
Absolute Monocytes: 462 cells/uL (ref 200–950)
Basophils Absolute: 40 cells/uL (ref 0–200)
Basophils Relative: 0.7 %
Eosinophils Absolute: 68 cells/uL (ref 15–500)
Eosinophils Relative: 1.2 %
HCT: 42.4 % (ref 35.0–45.0)
Hemoglobin: 12.9 g/dL (ref 11.7–15.5)
Lymphs Abs: 701 cells/uL — ABNORMAL LOW (ref 850–3900)
MCH: 22.7 pg — ABNORMAL LOW (ref 27.0–33.0)
MCHC: 30.4 g/dL — ABNORMAL LOW (ref 32.0–36.0)
MCV: 74.6 fL — ABNORMAL LOW (ref 80.0–100.0)
MPV: 10.5 fL (ref 7.5–12.5)
Monocytes Relative: 8.1 %
Neutro Abs: 4429 cells/uL (ref 1500–7800)
Neutrophils Relative %: 77.7 %
Platelets: 244 10*3/uL (ref 140–400)
RBC: 5.68 10*6/uL — ABNORMAL HIGH (ref 3.80–5.10)
RDW: 15.6 % — ABNORMAL HIGH (ref 11.0–15.0)
Total Lymphocyte: 12.3 %
WBC: 5.7 10*3/uL (ref 3.8–10.8)

## 2020-01-17 LAB — COMPLETE METABOLIC PANEL WITH GFR
AG Ratio: 1.5 (calc) (ref 1.0–2.5)
ALT: 12 U/L (ref 6–29)
AST: 15 U/L (ref 10–35)
Albumin: 4.1 g/dL (ref 3.6–5.1)
Alkaline phosphatase (APISO): 65 U/L (ref 37–153)
BUN: 14 mg/dL (ref 7–25)
CO2: 27 mmol/L (ref 20–32)
Calcium: 9.1 mg/dL (ref 8.6–10.4)
Chloride: 103 mmol/L (ref 98–110)
Creat: 0.9 mg/dL (ref 0.60–0.93)
GFR, Est African American: 75 mL/min/{1.73_m2} (ref >=60)
GFR, Est Non African American: 65 mL/min/{1.73_m2} (ref >=60)
Globulin: 2.7 g/dL (calc) (ref 1.9–3.7)
Glucose, Bld: 84 mg/dL (ref 65–99)
Potassium: 4.3 mmol/L (ref 3.5–5.3)
Sodium: 140 mmol/L (ref 135–146)
Total Bilirubin: 0.5 mg/dL (ref 0.2–1.2)
Total Protein: 6.8 g/dL (ref 6.1–8.1)

## 2020-01-17 LAB — LIPID PANEL
Cholesterol: 201 mg/dL — ABNORMAL HIGH (ref <200)
HDL: 86 mg/dL (ref >=50)
LDL Cholesterol (Calc): 102 mg/dL (calc) — ABNORMAL HIGH
Non-HDL Cholesterol (Calc): 115 mg/dL (calc) (ref <130)
Total CHOL/HDL Ratio: 2.3 (calc) (ref <5.0)
Triglycerides: 51 mg/dL (ref <150)

## 2020-01-17 LAB — HEMOGLOBIN A1C
Hgb A1c MFr Bld: 5.5 % of total Hgb (ref <5.7)
Mean Plasma Glucose: 111 (calc)
eAG (mmol/L): 6.2 (calc)

## 2020-01-17 NOTE — Progress Notes (Signed)
Labs look good. Continue her b12 supplement.   See her as planned in 6 months

## 2020-01-23 ENCOUNTER — Ambulatory Visit: Payer: Medicare Other | Admitting: Nurse Practitioner

## 2020-01-29 ENCOUNTER — Other Ambulatory Visit: Payer: Self-pay

## 2020-01-29 ENCOUNTER — Ambulatory Visit (INDEPENDENT_AMBULATORY_CARE_PROVIDER_SITE_OTHER): Payer: Medicare Other | Admitting: Nurse Practitioner

## 2020-01-29 ENCOUNTER — Ambulatory Visit (HOSPITAL_COMMUNITY)
Admission: RE | Admit: 2020-01-29 | Discharge: 2020-01-29 | Disposition: A | Discharge status: Home / Self Care | Payer: Medicare Other | Source: Ambulatory Visit | Attending: Nurse Practitioner | Admitting: Nurse Practitioner

## 2020-01-29 VITALS — BP 114/80 | HR 68 | Temp 97.6°F | Resp 18 | Wt 117.0 lb

## 2020-01-29 DIAGNOSIS — M79662 Pain in left lower leg: Secondary | ICD-10-CM

## 2020-01-29 DIAGNOSIS — S86812A Strain of other muscle(s) and tendon(s) at lower leg level, left leg, initial encounter: Secondary | ICD-10-CM

## 2020-01-29 NOTE — Progress Notes (Signed)
.  Established Patient Office Visit  Subjective:  Patient ID: Brandy Ramos, female    DOB: 12/11/1950  Age: 69 y.o. MRN: [id number]  CC:  Chief Complaint  Patient presents with  . Leg Pain    L calf pain, started x1 month, hurts when inclining and walking    HPI Brandy Ramos is a 69 year old female presenting to the clinic with sxs of left calf pain that started 3 weeks ago that is exacerbated when walking, more with walking inclined, and aches at night when in bed. She has tried no treatments. She has stopped her routine of daily walking for the past 2 weeks. This has not decreased her sxs. Denied hot red spot on calf, numbness, tingling, change in temperature, weakness.   No known injury, no cp, ct, gu/gi sxs, other pian, sob, palpitation.  Past Medical History:  Diagnosis Date  . Anxiety state, unspecified 06/16/2007  . 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 DEPRESSION 11/04/2007  . History of radiation therapy 01/2013   Gamma Knife extend 32 Gy in 4 fractions to a right retropharyngeal lymph node  . History of radiation therapy 11/08/2015   GK extend 32 gy in 4 fractions to Left Retropharyngeal LN  . HYPERTENSION 12/11/2008  . INSOMNIA 06/16/2007  . 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 OSTEOPOROSIS 11/04/2007   S/P 5 years of Fosamax therapy  . Palpitations 07/11/2008  . S/P radiation > 12 weeks 05/30/2009-07/17/09   completed radiation therapy January 2011  . S/P radiation therapy greater than twelve weeks ago 07/04/12-08/18/12   r neck& draining lymph nodes,59.4Gy/393fx  . SHOULDER PAIN, RIGHT 05/29/2008    Past Surgical History:  Procedure Laterality Date  . CESAREAN SECTION    . LEFT HEART CATH AND CORONARY ANGIOGRAPHY N/A 08/25/2016   Procedure: Left Heart Cath and Coronary Angiography;  Surgeon: Charolette Forward, MD;  Location: [facility];  Service: Cardiovascular;  Laterality: N/A;  . right partial maxillectomy  02/2009  . right selective neck dissection  05/23/2012  . SKIN GRAFT  2010   right maxillectomy    Family History  Problem Relation Age of Onset  . Heart disease Mother   . Hypertension Mother   . Cancer Father   . Breast cancer Sister     Social History   Socioeconomic History  . Marital status: Married    Spouse name: Not on file  . Number of children: Not on file  . Years of education: Not on file  . Highest education level: Not on file  Occupational History  . Not on file  Tobacco Use  . Smoking status: Never Smoker  . Smokeless tobacco: Never Used  Substance and Sexual Activity  . Alcohol use: Yes    Comment: very rare glass of wine, occ'l beer  . Drug use: No  . Sexual activity: Not Currently  Other Topics Concern  . Not on file  Social History Narrative   The patient is married and has 3 children.   The patient is a nonsmoker.  Patient has never used smokeless tobacco products.   The patient with rare use of alcohol, drinking wine or beer.   The patient's mother is alive at age is 68 with a history of heart disease, multiple stents, and hypertension.   The father passed away at the age of 22 from [place]  Social Determinants of Health   Financial Resource Strain:   . Difficulty of Paying Living Expenses:   Food Insecurity:   . Worried About Charity fundraiser in the Last Year:   . Arboriculturist in the Last Year:   Transportation Needs:   . Film/video editor (Medical):   Marland Kitchen Lack of Transportation (Non-Medical):   Physical Activity:   . Days of Exercise per Week:   . Minutes of Exercise per Session:   Stress:   . Feeling of Stress :   Social Connections:   . Frequency of Communication with Friends and Family:   . Frequency of Social Gatherings with Friends and Family:   . Attends Religious Services:   . Active Member of Clubs or Organizations:   . Attends English as a second language teacher Meetings:   Marland Kitchen Marital Status:   Intimate Partner Violence:   . Fear of Current or Ex-Partner:   . Emotionally Abused:   Marland Kitchen Physically Abused:   . Sexually Abused:     Outpatient Medications Prior to Visit  Medication Sig Dispense Refill  . amLODipine (NORVASC) 5 MG tablet amlodipine 5 mg tablet  TAKE 1 TABLET BY MOUTH EVERY DAY    . aspirin EC 81 MG tablet TAKE 1 TABLET EVERY DAY    . B Complex Vitamins (B COMPLEX PO) Take 1 tablet by mouth daily.    . Cholecalciferol (VITAMIN D-3) 5000 units TABS Take 5,000 Units by mouth daily.    . Multiple Vitamins-Minerals (MULTIVITAMIN PO) Take 1 tablet by mouth daily.    . Omega-3 Fatty Acids (FISH OIL PO) Take 1 capsule by mouth daily.     No facility-administered medications prior to visit.    No Known Allergies  ROS Review of Systems  All other systems reviewed and are negative.     Objective:    Physical Exam Vitals and nursing note reviewed.  Constitutional:      General: She is not in acute distress.    Appearance: Normal appearance. She is not ill-appearing, toxic-appearing or diaphoretic.  HENT:     Head: Normocephalic.  Eyes:     Extraocular Movements: Extraocular movements intact.     Conjunctiva/sclera: Conjunctivae normal.     Pupils: Pupils are equal, round, and reactive to light.  Neck:     Vascular: No carotid bruit.  Cardiovascular:     Rate and Rhythm: Normal rate.     Pulses: Normal pulses.  Pulmonary:     Effort: Pulmonary effort is normal.  Musculoskeletal:        General: No swelling. Normal range of motion.     Cervical back: Normal range of motion and neck supple.     Left lower leg: Tenderness present. No swelling, deformity, lacerations or bony tenderness. No edema.       Legs:     Comments: Drawn region is the area of tenderness, no edema, erythema, normal temperature.  Skin:    General: Skin is warm and dry.     Capillary Refill: Capillary refill takes less than 2 seconds.      Coloration: Skin is not jaundiced or pale.     Findings: No bruising, erythema or rash.  Neurological:     General: No focal deficit present.     Mental Status: She is alert and oriented to person, place, and time.     GCS: GCS eye subscore is 4. GCS verbal subscore is 5. GCS motor subscore is 6.  Sensory: Sensation is intact.  Psychiatric:        Attention and Perception: Attention and perception normal.        Mood and Affect: Mood and affect normal.        Speech: Speech normal.        Behavior: Behavior normal.        Thought Content: Thought content normal.        Cognition and Memory: Cognition normal.        Judgment: Judgment normal.     BP 114/80 (BP Location: Left Arm, Patient Position: Sitting, Cuff Size: Normal)   Pulse 68   Temp 97.6 F (36.4 C) (Temporal)   Resp 18   Wt 117 lb (53.1 kg)   SpO2 97%   BMI 20.40 kg/m  Wt Readings from Last 3 Encounters:  01/29/20 117 lb (53.1 kg)  01/16/20 115 lb 9.6 oz (52.4 kg)  10/17/19 113 lb (51.3 kg)     Health Maintenance Due  Topic Date Due  . Hepatitis C Screening  Never done  . COVID-19 Vaccine (1) Never done  . PNA vac Low Risk Adult (1 of 2 - PCV13) Never done  . MAMMOGRAM  05/12/2018  . INFLUENZA VACCINE  01/28/2020    There are no preventive care reminders to display for this patient.  Lab Results  Component Value Date   TSH 1.34 05/07/2017   Lab Results  Component Value Date   WBC 5.7 01/16/2020   HGB 12.9 01/16/2020   HCT 42.4 01/16/2020   MCV 74.6 (L) 01/16/2020   PLT 244 01/16/2020   Lab Results  Component Value Date   NA 140 01/16/2020   K 4.3 01/16/2020   CO2 27 01/16/2020   GLUCOSE 84 01/16/2020   BUN 14 01/16/2020   CREATININE 0.90 01/16/2020   BILITOT 0.5 01/16/2020   ALKPHOS 67 05/07/2017   AST 15 01/16/2020   ALT 12 01/16/2020   PROT 6.8 01/16/2020   ALBUMIN 4.2 05/07/2017   CALCIUM 9.1 01/16/2020   GFR 75.37 05/07/2017   Lab Results  Component Value Date   CHOL 201  (H) 01/16/2020   Lab Results  Component Value Date   HDL 86 01/16/2020   Lab Results  Component Value Date   LDLCALC 102 (H) 01/16/2020   Lab Results  Component Value Date   TRIG 51 01/16/2020   Lab Results  Component Value Date   CHOLHDL 2.3 01/16/2020   Lab Results  Component Value Date   HGBA1C 5.5 01/16/2020      Assessment & Plan:   Problem List Items Addressed This Visit    None    Visit Diagnoses    Pain of left calf    -  Primary   Relevant Orders   US Venous Img Lower Unilateral Left   Strain of calf muscle, left, initial encounter         May use tylenol/ibuprofen, ice/heat, rest, for sxs. Complete u/s today to rule out/evaluate for DVT. As discussed if DVT will contact with treatment plan. If not DVT treat for one weak. May start back you routine of walking as tolerated. If your sxs persist will contact office to let us know to refer to vein specialist that you have contacted.   Seek medical attention for new, worsening, red flags discussed, or non resolving sxs promptly.  No orders of the defined types were placed in this encounter.   Follow-up: Return if symptoms worsen or fail to improve.  Annie Main, FNP

## 2020-02-01 ENCOUNTER — Other Ambulatory Visit: Payer: Self-pay | Admitting: Nurse Practitioner

## 2020-02-01 DIAGNOSIS — M79662 Pain in left lower leg: Secondary | ICD-10-CM

## 2020-02-01 NOTE — Progress Notes (Signed)
IMPRESSION: No acute DVT in the visualized LEFT lower extremity.

## 2020-02-01 NOTE — Progress Notes (Signed)
Order placed

## 2020-02-06 DIAGNOSIS — R05 Cough: Secondary | ICD-10-CM | POA: Diagnosis not present

## 2020-02-19 ENCOUNTER — Encounter (INDEPENDENT_AMBULATORY_CARE_PROVIDER_SITE_OTHER): Payer: Self-pay | Admitting: Vascular Surgery

## 2020-02-19 ENCOUNTER — Ambulatory Visit (INDEPENDENT_AMBULATORY_CARE_PROVIDER_SITE_OTHER): Payer: Medicare Other | Admitting: Vascular Surgery

## 2020-02-19 ENCOUNTER — Other Ambulatory Visit: Payer: Self-pay

## 2020-02-19 VITALS — BP 159/88 | HR 77 | Resp 16 | Ht 63.5 in | Wt 115.8 lb

## 2020-02-19 DIAGNOSIS — M79604 Pain in right leg: Secondary | ICD-10-CM | POA: Diagnosis not present

## 2020-02-19 DIAGNOSIS — N83209 Unspecified ovarian cyst, unspecified side: Secondary | ICD-10-CM | POA: Insufficient documentation

## 2020-02-19 DIAGNOSIS — D259 Leiomyoma of uterus, unspecified: Secondary | ICD-10-CM | POA: Insufficient documentation

## 2020-02-19 DIAGNOSIS — I10 Essential (primary) hypertension: Secondary | ICD-10-CM

## 2020-02-19 DIAGNOSIS — M8949 Other hypertrophic osteoarthropathy, multiple sites: Secondary | ICD-10-CM

## 2020-02-19 DIAGNOSIS — M150 Primary generalized (osteo)arthritis: Secondary | ICD-10-CM

## 2020-02-19 DIAGNOSIS — R636 Underweight: Secondary | ICD-10-CM | POA: Insufficient documentation

## 2020-02-19 DIAGNOSIS — K219 Gastro-esophageal reflux disease without esophagitis: Secondary | ICD-10-CM

## 2020-02-19 DIAGNOSIS — M25561 Pain in right knee: Secondary | ICD-10-CM | POA: Insufficient documentation

## 2020-02-19 DIAGNOSIS — R7309 Other abnormal glucose: Secondary | ICD-10-CM | POA: Insufficient documentation

## 2020-02-19 DIAGNOSIS — M159 Polyosteoarthritis, unspecified: Secondary | ICD-10-CM

## 2020-02-19 DIAGNOSIS — E7800 Pure hypercholesterolemia, unspecified: Secondary | ICD-10-CM | POA: Diagnosis not present

## 2020-02-19 DIAGNOSIS — C760 Malignant neoplasm of head, face and neck: Secondary | ICD-10-CM | POA: Insufficient documentation

## 2020-02-19 DIAGNOSIS — M79605 Pain in left leg: Secondary | ICD-10-CM

## 2020-02-19 DIAGNOSIS — E559 Vitamin D deficiency, unspecified: Secondary | ICD-10-CM | POA: Insufficient documentation

## 2020-03-06 ENCOUNTER — Encounter (INDEPENDENT_AMBULATORY_CARE_PROVIDER_SITE_OTHER): Payer: Self-pay | Admitting: Vascular Surgery

## 2020-03-06 DIAGNOSIS — M1990 Unspecified osteoarthritis, unspecified site: Secondary | ICD-10-CM | POA: Insufficient documentation

## 2020-03-06 DIAGNOSIS — M79606 Pain in leg, unspecified: Secondary | ICD-10-CM | POA: Insufficient documentation

## 2020-03-06 NOTE — Progress Notes (Signed)
MRN : [id number]  Brandy Ramos is a 69 y.o. (10-Jan-1951) female who presents with chief complaint of  Chief Complaint  Patient presents with  . New Patient (Initial Visit)    ref Para [place]ans le pain  .  History of Present Illness:   The patient is seen for evaluation of painful lower extremities. Patient notes the pain is variable and not always associated with activity.  The pain is somewhat consistent day to day occurring on most days. The patient notes the pain also occurs with standing and routinely seems worse as the day wears on. The pain has been progressive over the past several years. The patient states these symptoms are causing  a profound negative impact on quality of life and daily activities.  The patient denies rest pain or dangling of an extremity off the side of the bed during the night for relief. No open wounds or sores at this time. No history of DVT or phlebitis. No prior interventions or surgeries.  There is a  history of back problems and DJD of the lumbar and sacral spine.    Current Meds  Medication Sig  . amLODipine (NORVASC) 5 MG tablet amlodipine 5 mg tablet  TAKE 1 TABLET BY MOUTH EVERY DAY  . aspirin EC 81 MG tablet TAKE 1 TABLET EVERY DAY  . atorvastatin (LIPITOR) 20 MG tablet Take 20 mg by mouth daily.  . B Complex Vitamins (B COMPLEX PO) Take 1 tablet by mouth daily.  . Cholecalciferol (VITAMIN D-3) 5000 units TABS Take 5,000 Units by mouth daily.  . Multiple Vitamins-Minerals (MULTIVITAMIN PO) Take 1 tablet by mouth daily.  . Omega-3 Fatty Acids (FISH OIL PO) Take 1 capsule by mouth daily.    Past Medical History:  Diagnosis Date  . Anxiety state, unspecified 06/16/2007  . 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 DEPRESSION 11/04/2007  . History of radiation therapy 01/2013   Gamma Knife extend 32 Gy in 4 fractions to a right retropharyngeal  lymph node  . History of radiation therapy 11/08/2015   GK extend 32 gy in 4 fractions to Left Retropharyngeal LN  . HYPERTENSION 12/11/2008  . INSOMNIA 06/16/2007  . 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 OSTEOPOROSIS 11/04/2007   S/P 5 years of Fosamax therapy  . Palpitations 07/11/2008  . S/P radiation > 12 weeks 05/30/2009-07/17/09   completed radiation therapy January 2011  . S/P radiation therapy greater than twelve weeks ago 07/04/12-08/18/12   r neck& draining lymph nodes,59.4Gy/329fx  . SHOULDER PAIN, RIGHT 05/29/2008    Past Surgical History:  Procedure Laterality Date  . CESAREAN SECTION    . LEFT HEART CATH AND CORONARY ANGIOGRAPHY N/A 08/25/2016   Procedure: Left Heart Cath and Coronary Angiography;  Surgeon: Charolette Forward, MD;  Location: [facility];  Service: Cardiovascular;  Laterality: N/A;  . right partial maxillectomy  02/2009  . right selective neck dissection  05/23/2012  . SKIN GRAFT  2010   right maxillectomy    Social History Social History   Tobacco Use  . Smoking status: Never Smoker  . Smokeless tobacco: Never Used  Substance Use Topics  . Alcohol use: Yes    Comment: very rare glass of wine, occ'l beer  . Drug use: No    Family History Family History  Problem Relation Age of Onset  . Heart disease Mother   .  Hypertension Mother   . Cancer Father   . Breast cancer Sister   No family history of bleeding/clotting disorders, porphyria or autoimmune disease   No Known Allergies   REVIEW OF SYSTEMS (Negative unless checked)  Constitutional: [] Weight loss  [] Fever  [] Chills Cardiac: [] Chest pain   [] Chest pressure   [] Palpitations   [] Shortness of breath when laying flat   [] Shortness of breath with exertion. Vascular:  [x] Pain in legs with walking   [x] Pain in legs at rest  [] History of DVT   [] Phlebitis   [] Swelling in legs   [] Varicose veins   [] Non-healing ulcers Pulmonary:   [] Uses home oxygen   [] Productive cough    [] Hemoptysis   [] Wheeze  [] COPD   [] Asthma Neurologic:  [] Dizziness   [] Seizures   [] History of stroke   [] History of TIA  [] Aphasia   [] Vissual changes   [] Weakness or numbness in arm   [] Weakness or numbness in leg Musculoskeletal:   [] Joint swelling   [x] Joint pain   [] Low back pain Hematologic:  [] Easy bruising  [] Easy bleeding   [] Hypercoagulable state   [] Anemic Gastrointestinal:  [] Diarrhea   [] Vomiting  [x] Gastroesophageal reflux/heartburn   [] Difficulty swallowing. Genitourinary:  [] Chronic kidney disease   [] Difficult urination  [] Frequent urination   [] Blood in urine Skin:  [] Rashes   [] Ulcers  Psychological:  [] History of anxiety   []  History of major depression.  Physical Examination  Vitals:   02/19/20 1030  BP: (!) 159/88  Pulse: 77  Resp: 16  Weight: 115 lb 12.8 oz (52.5 kg)  Height: 5' 3.5" (1.613 m)   Body mass index is 20.19 kg/m. Gen: WD/WN, NAD Head: [place]/AT, No temporalis wasting.  Ear/Nose/Throat: Hearing grossly intact, nares w/o erythema or drainage, poor dentition Eyes: PER, EOMI, sclera nonicteric.  Neck: Supple, no masses.  No bruit or JVD.  Pulmonary:  Good air movement, clear to auscultation bilaterally, no use of accessory muscles.  Cardiac: RRR, normal S1, S2, no Murmurs. Vascular:  Vessel Right Left  Radial Palpable Palpable  PT Palpable Palpable  DP Palpable Palpable  Gastrointestinal: soft, non-distended. No guarding/no peritoneal signs.  Musculoskeletal: M/S 5/5 throughout.  No deformity or atrophy.  Neurologic: CN 2-12 intact. Pain and light touch intact in extremities.  Symmetrical.  Speech is fluent. Motor exam as listed above. Psychiatric: Judgment intact, Mood & affect appropriate for pt's clinical situation. Dermatologic: No rashes or ulcers noted.  No changes consistent with cellulitis.  CBC Lab Results  Component Value Date   WBC 5.7 01/16/2020   HGB 12.9 01/16/2020   HCT 42.4 01/16/2020   MCV 74.6 (L) 01/16/2020   PLT 244  01/16/2020    BMET    Component Value Date/Time   NA 140 01/16/2020 1051   K 4.3 01/16/2020 1051   CL 103 01/16/2020 1051   CO2 27 01/16/2020 1051   GLUCOSE 84 01/16/2020 1051   GLUCOSE 110 (H) 04/14/2006 1035   BUN 14 01/16/2020 1051   BUN 10.0 06/08/2012 1118   CREATININE 0.90 01/16/2020 1051   CREATININE 0.8 06/08/2012 1118   CALCIUM 9.1 01/16/2020 1051   GFRNONAA 65 01/16/2020 1051   GFRAA 75 01/16/2020 1051   CrCl cannot be calculated (Patient's most recent lab result is older than the maximum 21 days allowed.).  COAG Lab Results  Component Value Date   INR 0.96 04/08/2012    Radiology No results found.  Assessment/Plan 1. Pain in both lower extremities Recommend:  I do not find evidence of Vascular  pathology that would explain the patient's symptoms  The patient has atypical pain symptoms for vascular disease  I do not find evidence of Vascular pathology that would explain the patient's symptoms and I suspect the patient is c/o pseudoclaudication.  Patient should have an evaluation of his LS spine which I defer to the primary service.  Noninvasive studies including venous ultrasound of the legs do not identify vascular problems  The patient should continue walking and begin a more formal exercise program. The patient should continue his antiplatelet therapy and aggressive treatment of the lipid abnormalities. The patient should begin wearing graduated compression socks 15-20 mmHg strength to control her mild edema.  Patient will follow-up with me on a PRN basis  Further work-up of her lower extremity pain is deferred to the primary service     2. Primary osteoarthritis involving multiple joints Recommend:  I do not find evidence of Vascular pathology that would explain the patient's symptoms  The patient has atypical pain symptoms for vascular disease  I do not find evidence of Vascular pathology that would explain the patient's symptoms and I suspect  the patient is c/o pseudoclaudication.  Patient should have an evaluation of his LS spine which I defer to the primary service.  Noninvasive studies including venous ultrasound of the legs do not identify vascular problems  The patient should continue walking and begin a more formal exercise program. The patient should continue his antiplatelet therapy and aggressive treatment of the lipid abnormalities. The patient should begin wearing graduated compression socks 15-20 mmHg strength to control her mild edema.  Patient will follow-up with me on a PRN basis  Further work-up of her lower extremity pain is deferred to the primary service     3. Essential hypertension Continue antihypertensive medications as already ordered, these medications have been reviewed and there are no changes at this time.   4. Gastroesophageal reflux disease without esophagitis Continue PPI as already ordered, this medication has been reviewed and there are no changes at this time.  Avoidence of caffeine and alcohol  Moderate elevation of the head of the bed   5. Hypercholesteremia Continue statin as ordered and reviewed, no changes at this time     Hortencia Pilar, MD  03/06/2020 12:16 PM

## 2020-04-08 DIAGNOSIS — E785 Hyperlipidemia, unspecified: Secondary | ICD-10-CM | POA: Diagnosis not present

## 2020-04-08 DIAGNOSIS — I201 Angina pectoris with documented spasm: Secondary | ICD-10-CM | POA: Diagnosis not present

## 2020-04-08 DIAGNOSIS — I10 Essential (primary) hypertension: Secondary | ICD-10-CM | POA: Diagnosis not present

## 2020-04-08 DIAGNOSIS — K219 Gastro-esophageal reflux disease without esophagitis: Secondary | ICD-10-CM | POA: Diagnosis not present

## 2020-07-18 ENCOUNTER — Ambulatory Visit: Payer: Medicare Other | Admitting: Nurse Practitioner

## 2020-07-23 DIAGNOSIS — L2084 Intrinsic (allergic) eczema: Secondary | ICD-10-CM | POA: Diagnosis not present

## 2020-08-20 DIAGNOSIS — Z1231 Encounter for screening mammogram for malignant neoplasm of breast: Secondary | ICD-10-CM | POA: Diagnosis not present

## 2020-08-27 DIAGNOSIS — I10 Essential (primary) hypertension: Secondary | ICD-10-CM | POA: Diagnosis not present

## 2020-08-27 DIAGNOSIS — I201 Angina pectoris with documented spasm: Secondary | ICD-10-CM | POA: Diagnosis not present

## 2020-08-27 DIAGNOSIS — E785 Hyperlipidemia, unspecified: Secondary | ICD-10-CM | POA: Diagnosis not present

## 2020-08-27 DIAGNOSIS — R0789 Other chest pain: Secondary | ICD-10-CM | POA: Diagnosis not present

## 2020-10-17 DIAGNOSIS — M25511 Pain in right shoulder: Secondary | ICD-10-CM | POA: Diagnosis not present

## 2020-10-21 DIAGNOSIS — M25511 Pain in right shoulder: Secondary | ICD-10-CM | POA: Diagnosis not present

## 2020-10-28 DIAGNOSIS — M25511 Pain in right shoulder: Secondary | ICD-10-CM | POA: Diagnosis not present

## 2020-11-04 DIAGNOSIS — M25511 Pain in right shoulder: Secondary | ICD-10-CM | POA: Diagnosis not present

## 2020-11-11 DIAGNOSIS — H2513 Age-related nuclear cataract, bilateral: Secondary | ICD-10-CM | POA: Diagnosis not present

## 2020-11-11 DIAGNOSIS — H10413 Chronic giant papillary conjunctivitis, bilateral: Secondary | ICD-10-CM | POA: Diagnosis not present

## 2020-11-11 DIAGNOSIS — H40013 Open angle with borderline findings, low risk, bilateral: Secondary | ICD-10-CM | POA: Diagnosis not present

## 2020-11-18 DIAGNOSIS — M25511 Pain in right shoulder: Secondary | ICD-10-CM | POA: Diagnosis not present

## 2020-11-22 ENCOUNTER — Encounter: Payer: Self-pay | Admitting: Internal Medicine

## 2020-11-22 ENCOUNTER — Ambulatory Visit (INDEPENDENT_AMBULATORY_CARE_PROVIDER_SITE_OTHER): Payer: Medicare Other | Admitting: Internal Medicine

## 2020-11-22 ENCOUNTER — Other Ambulatory Visit: Payer: Self-pay

## 2020-11-22 VITALS — BP 116/70 | HR 72 | Temp 98.6°F | Resp 18 | Ht 63.5 in | Wt 114.8 lb

## 2020-11-22 DIAGNOSIS — E7800 Pure hypercholesterolemia, unspecified: Secondary | ICD-10-CM | POA: Diagnosis not present

## 2020-11-22 DIAGNOSIS — I10 Essential (primary) hypertension: Secondary | ICD-10-CM

## 2020-11-22 DIAGNOSIS — R636 Underweight: Secondary | ICD-10-CM | POA: Diagnosis not present

## 2020-11-22 DIAGNOSIS — C089 Malignant neoplasm of major salivary gland, unspecified: Secondary | ICD-10-CM

## 2020-11-22 LAB — LIPID PANEL
Cholesterol: 236 mg/dL — ABNORMAL HIGH (ref 0–200)
HDL: 70.9 mg/dL (ref >39.00)
LDL Cholesterol: 155 mg/dL — ABNORMAL HIGH (ref 0–99)
NonHDL: 164.74
Total CHOL/HDL Ratio: 3
Triglycerides: 51 mg/dL (ref 0.0–149.0)
VLDL: 10.2 mg/dL (ref 0.0–40.0)

## 2020-11-22 LAB — COMPREHENSIVE METABOLIC PANEL
ALT: 11 U/L (ref 0–35)
AST: 15 U/L (ref 0–37)
Albumin: 4.1 g/dL (ref 3.5–5.2)
Alkaline Phosphatase: 65 U/L (ref 39–117)
BUN: 12 mg/dL (ref 6–23)
CO2: 30 mEq/L (ref 19–32)
Calcium: 9.2 mg/dL (ref 8.4–10.5)
Chloride: 104 mEq/L (ref 96–112)
Creatinine, Ser: 0.81 mg/dL (ref 0.40–1.20)
GFR: 73.27 mL/min (ref >60.00)
Glucose, Bld: 100 mg/dL — ABNORMAL HIGH (ref 70–99)
Potassium: 4.2 mEq/L (ref 3.5–5.1)
Sodium: 140 mEq/L (ref 135–145)
Total Bilirubin: 0.4 mg/dL (ref 0.2–1.2)
Total Protein: 7.1 g/dL (ref 6.0–8.3)

## 2020-11-22 LAB — CBC
HCT: 40 % (ref 36.0–46.0)
Hemoglobin: 12.8 g/dL (ref 12.0–15.0)
MCHC: 32 g/dL (ref 30.0–36.0)
MCV: 70.1 fl — ABNORMAL LOW (ref 78.0–100.0)
Platelets: 232 10*3/uL (ref 150.0–400.0)
RBC: 5.71 Mil/uL — ABNORMAL HIGH (ref 3.87–5.11)
RDW: 15.7 % — ABNORMAL HIGH (ref 11.5–15.5)
WBC: 4.6 10*3/uL (ref 4.0–10.5)

## 2020-11-22 NOTE — Patient Instructions (Addendum)
We could consider going down to 1/2 pill of the amlodipine daily and monitor the blood pressure. The goal for you would be <140/90.

## 2020-11-22 NOTE — Assessment & Plan Note (Signed)
Weight stable overall.

## 2020-11-22 NOTE — Assessment & Plan Note (Signed)
BP at goal on amlodipine 5 mg daily. Given BP here could consider trial off or half dose and follow up 1-2 months later to evaluate. Checking CMP and CBC today.

## 2020-11-22 NOTE — Assessment & Plan Note (Signed)
S/P resection and per patient she does not think this is active. Will get records if possible.

## 2020-11-22 NOTE — Progress Notes (Signed)
   Subjective:   Patient ID: Brandy Ramos, female    DOB: 01-18-1951, 70 y.o.   MRN: [id number]  HPI The patient is a new 70 YO female coming in for follow up cholesterol (not taking medication although this is on her list, she had never taken it, her cardiologist prescribed it and she was able to use diet to lower, diet has been only okay lately and wants to see where this stands), and blood pressure (taking amlodipine 5 mg daily, not sure if this could be stopped or lowered, exercising a good amount) and hx maxillary cancer (had surgery and reconstruction and to her knowledge this has been fine since, does not think she is in any kind of specific follow up for this).   PMH, [facility], social history reviewed and updated  Review of Systems  Constitutional: Negative.   HENT: Negative.   Eyes: Negative.   Respiratory: Negative for cough, chest tightness and shortness of breath.   Cardiovascular: Negative for chest pain, palpitations and leg swelling.  Gastrointestinal: Negative for abdominal distention, abdominal pain, constipation, diarrhea, nausea and vomiting.  Musculoskeletal: Positive for arthralgias.  Skin: Negative.   Neurological: Negative.   Psychiatric/Behavioral: Negative.     Objective:  Physical Exam Constitutional:      Appearance: She is well-developed.  HENT:     Head: Normocephalic and atraumatic.  Cardiovascular:     Rate and Rhythm: Normal rate and regular rhythm.  Pulmonary:     Effort: Pulmonary effort is normal. No respiratory distress.     Breath sounds: Normal breath sounds. No wheezing or rales.  Abdominal:     General: Bowel sounds are normal. There is no distension.     Palpations: Abdomen is soft.     Tenderness: There is no abdominal tenderness. There is no rebound.  Musculoskeletal:     Cervical back: Normal range of motion.  Skin:    General: Skin is warm and dry.  Neurological:     Mental Status: She is alert and oriented to person, place, and  time.     Coordination: Coordination normal.     Vitals:   11/22/20 0959  BP: 116/70  Pulse: 72  Resp: 18  Temp: 98.6 F (37 C)  TempSrc: Oral  SpO2: 97%  Weight: 114 lb 12.8 oz (52.1 kg)  Height: 5' 3.5" (1.613 m)    This visit occurred during the SARS-CoV-2 public health emergency.  Safety protocols were in place, including screening questions prior to the visit, additional usage of staff PPE, and extensive cleaning of exam room while observing appropriate contact time as indicated for disinfecting solutions.   Assessment & Plan:

## 2020-11-22 NOTE — Assessment & Plan Note (Signed)
Checking lipid panel, not on meds.

## 2020-12-05 ENCOUNTER — Telehealth: Payer: Self-pay | Admitting: Internal Medicine

## 2020-12-05 NOTE — Telephone Encounter (Signed)
Called patient, unable to get in contact with her due to her voicemail not being setup. Please see result note.

## 2020-12-05 NOTE — Telephone Encounter (Signed)
Please call to discuss lab results 

## 2020-12-09 ENCOUNTER — Other Ambulatory Visit: Payer: Self-pay

## 2020-12-09 ENCOUNTER — Ambulatory Visit (INDEPENDENT_AMBULATORY_CARE_PROVIDER_SITE_OTHER): Payer: Medicare Other | Admitting: Internal Medicine

## 2020-12-09 DIAGNOSIS — R10A2 Flank pain, left side: Secondary | ICD-10-CM

## 2020-12-09 DIAGNOSIS — R109 Unspecified abdominal pain: Secondary | ICD-10-CM | POA: Diagnosis not present

## 2020-12-09 NOTE — Patient Instructions (Signed)
You can use ice for the pain.

## 2020-12-09 NOTE — Progress Notes (Signed)
   Subjective:   Patient ID: Brandy Ramos, female    DOB: [birth year], 70 y.o.   MRN: [id number]  HPI The patient is a 70 YO female coming in for tick bite on the left shoulder Sunday. She noticed this and got the tick off. There is a lump where the tick was. She brought the tick in for inspection. Denies fevers or chills. Denies muscle aches. Tick was not on >12 hours.   Review of Systems  Constitutional: Negative.   HENT: Negative.    Eyes: Negative.   Respiratory:  Negative for cough, chest tightness and shortness of breath.   Cardiovascular:  Negative for chest pain, palpitations and leg swelling.  Gastrointestinal:  Negative for abdominal distention, abdominal pain, constipation, diarrhea, nausea and vomiting.  Musculoskeletal: Negative.   Skin:  Positive for wound.  Neurological: Negative.   Psychiatric/Behavioral: Negative.     Objective:  Physical Exam Constitutional:      Appearance: She is well-developed.  HENT:     Head: Normocephalic and atraumatic.  Cardiovascular:     Rate and Rhythm: Normal rate and regular rhythm.  Pulmonary:     Effort: Pulmonary effort is normal. No respiratory distress.     Breath sounds: Normal breath sounds. No wheezing or rales.  Abdominal:     General: Bowel sounds are normal. There is no distension.     Palpations: Abdomen is soft.     Tenderness: There is no abdominal tenderness. There is no rebound.  Musculoskeletal:     Cervical back: Normal range of motion.     Comments: Tick bite left flank/shoulder region without surrounding erythema or target appearance  Skin:    General: Skin is warm and dry.  Neurological:     Mental Status: She is alert and oriented to person, place, and time.     Coordination: Coordination normal.    Vitals:   12/09/20 1004  BP: (!) 150/60  Pulse: 82  Temp: 98.3 F (36.8 C)  TempSrc: Oral  SpO2: 98%  Weight: 112 lb (50.8 kg)  Height: 5' 3.5" (1.613 m)    This visit occurred during the  SARS-CoV-2 public health emergency.  Safety protocols were in place, including screening questions prior to the visit, additional usage of staff PPE, and extensive cleaning of exam room while observing appropriate contact time as indicated for disinfecting solutions.   Assessment & Plan:

## 2020-12-11 ENCOUNTER — Encounter: Payer: Self-pay | Admitting: Internal Medicine

## 2020-12-11 DIAGNOSIS — R109 Unspecified abdominal pain: Secondary | ICD-10-CM | POA: Insufficient documentation

## 2020-12-11 NOTE — Telephone Encounter (Signed)
Unable to leave voice message, recording voice mail not set up yet.

## 2020-12-11 NOTE — Telephone Encounter (Signed)
Result note from labs drawn 11/22/20: "Please call and let her know that the cholesterol is high and we should consider the cholesterol medicine or get her diet back on track and recheck inabout 3 months."

## 2020-12-11 NOTE — Assessment & Plan Note (Signed)
From tick bite. Advised to use tylenol or ice for pain. Tick was inspected and identified. Was not a deer tick and tick not known to cause tick bourne illnesses. Given that tick type and on skin less than 12 hours no prescription antibiotics are indicated. Advised on what to monitor for and for good skin check after being outdoors.

## 2020-12-11 NOTE — Telephone Encounter (Signed)
Patient is requesting a call back in regards to lab results.  °

## 2020-12-13 NOTE — Telephone Encounter (Signed)
Many attempts have been made to contact patient by mobile/home phone. Unable to reach to result message.

## 2020-12-18 NOTE — Telephone Encounter (Signed)
Please return call to patient to discuss labs

## 2021-01-11 DIAGNOSIS — M25511 Pain in right shoulder: Secondary | ICD-10-CM | POA: Diagnosis not present

## 2021-01-21 ENCOUNTER — Encounter: Payer: Medicare Other | Admitting: Internal Medicine

## 2021-02-04 ENCOUNTER — Encounter: Payer: Self-pay | Admitting: Internal Medicine

## 2021-02-04 ENCOUNTER — Encounter: Payer: Medicare Other | Admitting: Internal Medicine

## 2021-02-04 ENCOUNTER — Other Ambulatory Visit: Payer: Self-pay

## 2021-02-04 ENCOUNTER — Ambulatory Visit (INDEPENDENT_AMBULATORY_CARE_PROVIDER_SITE_OTHER): Payer: Medicare Other | Admitting: Internal Medicine

## 2021-02-04 VITALS — BP 122/80 | HR 76 | Temp 98.5°F | Resp 18 | Ht 63.5 in | Wt 114.0 lb

## 2021-02-04 DIAGNOSIS — E7800 Pure hypercholesterolemia, unspecified: Secondary | ICD-10-CM | POA: Diagnosis not present

## 2021-02-04 DIAGNOSIS — M79605 Pain in left leg: Secondary | ICD-10-CM | POA: Diagnosis not present

## 2021-02-04 DIAGNOSIS — Z0000 Encounter for general adult medical examination without abnormal findings: Secondary | ICD-10-CM | POA: Diagnosis not present

## 2021-02-04 DIAGNOSIS — M8949 Other hypertrophic osteoarthropathy, multiple sites: Secondary | ICD-10-CM

## 2021-02-04 DIAGNOSIS — M159 Polyosteoarthritis, unspecified: Secondary | ICD-10-CM

## 2021-02-04 DIAGNOSIS — I10 Essential (primary) hypertension: Secondary | ICD-10-CM | POA: Diagnosis not present

## 2021-02-04 LAB — LIPID PANEL
Cholesterol: 222 mg/dL — ABNORMAL HIGH (ref 0–200)
HDL: 74.4 mg/dL (ref >39.00)
LDL Cholesterol: 132 mg/dL — ABNORMAL HIGH (ref 0–99)
NonHDL: 147.14
Total CHOL/HDL Ratio: 3
Triglycerides: 76 mg/dL (ref 0.0–149.0)
VLDL: 15.2 mg/dL (ref 0.0–40.0)

## 2021-02-04 LAB — HEMOGLOBIN A1C: Hgb A1c MFr Bld: 5.9 % (ref 4.6–6.5)

## 2021-02-04 NOTE — Patient Instructions (Signed)
Think about getting the covid-19 vaccine and the flu shot this fall.

## 2021-02-04 NOTE — Progress Notes (Signed)
   Subjective:   Patient ID: Brandy Ramos, female    DOB: 1950-11-14, 70 y.o.   MRN: [id number]  HPI The patient is a 70 YO female coming in for physical.   PMH, [place], social history reviewed and updated  Review of Systems  Constitutional: Negative.   HENT: Negative.    Eyes: Negative.   Respiratory:  Negative for cough, chest tightness and shortness of breath.   Cardiovascular:  Negative for chest pain, palpitations and leg swelling.  Gastrointestinal:  Negative for abdominal distention, abdominal pain, constipation, diarrhea, nausea and vomiting.  Musculoskeletal:  Positive for arthralgias and myalgias.  Skin: Negative.   Neurological: Negative.   Psychiatric/Behavioral: Negative.     Objective:  Physical Exam Constitutional:      Appearance: She is well-developed.  HENT:     Head: Normocephalic and atraumatic.  Cardiovascular:     Rate and Rhythm: Normal rate and regular rhythm.  Pulmonary:     Effort: Pulmonary effort is normal. No respiratory distress.     Breath sounds: Normal breath sounds. No wheezing or rales.  Abdominal:     General: Bowel sounds are normal. There is no distension.     Palpations: Abdomen is soft.     Tenderness: There is no abdominal tenderness. There is no rebound.  Musculoskeletal:        General: Tenderness present.     Cervical back: Normal range of motion.  Skin:    General: Skin is warm and dry.  Neurological:     Mental Status: She is alert and oriented to person, place, and time.     Coordination: Coordination normal.    Vitals:   02/04/21 1341  BP: 122/80  Pulse: 76  Resp: 18  Temp: 98.5 F (36.9 C)  TempSrc: Oral  SpO2: 99%  Weight: 114 lb (51.7 kg)  Height: 5' 3.5" (1.613 m)    This visit occurred during the SARS-CoV-2 public health emergency.  Safety protocols were in place, including screening questions prior to the visit, additional usage of staff PPE, and extensive cleaning of exam room while observing  appropriate contact time as indicated for disinfecting solutions.   Assessment & Plan:

## 2021-02-04 NOTE — Assessment & Plan Note (Addendum)
BP at goal on amlodipine 5 mg daily. Recent CMP at goal will not adjust.

## 2021-02-06 DIAGNOSIS — M25511 Pain in right shoulder: Secondary | ICD-10-CM | POA: Diagnosis not present

## 2021-02-07 DIAGNOSIS — Z0000 Encounter for general adult medical examination without abnormal findings: Secondary | ICD-10-CM | POA: Insufficient documentation

## 2021-02-07 NOTE — Assessment & Plan Note (Signed)
Flu shot yearly. covid-19 counseled. Pneumonia declines. Shingrix declines. Tetanus due 2024. Colonoscopy due 2024. Mammogram due 2023, pap smear aged out and dexa declines further. Counseled about sun safety and mole surveillance. Counseled about the dangers of distracted driving. Given 10 year screening recommendations.

## 2021-02-07 NOTE — Assessment & Plan Note (Signed)
Checking lipid panel and adjust as needed not on statin.

## 2021-02-07 NOTE — Assessment & Plan Note (Signed)
Checking x-ray left knee and hip to rule out problems causing the chronic left leg pain.

## 2021-02-24 ENCOUNTER — Telehealth: Payer: Self-pay

## 2021-02-24 NOTE — Telephone Encounter (Signed)
8/11 results given to patient.

## 2021-02-25 ENCOUNTER — Ambulatory Visit (INDEPENDENT_AMBULATORY_CARE_PROVIDER_SITE_OTHER): Payer: Medicare Other

## 2021-02-25 DIAGNOSIS — M79605 Pain in left leg: Secondary | ICD-10-CM

## 2021-04-04 ENCOUNTER — Telehealth: Payer: Self-pay | Admitting: Internal Medicine

## 2021-04-04 NOTE — Telephone Encounter (Signed)
These were just checked in August and the sugar test cannot be checked more often than every 3 months and we usually do not do cholesterol except every 6-12 months unless we have started a new medication.

## 2021-04-04 NOTE — Telephone Encounter (Signed)
See below

## 2021-04-04 NOTE — Telephone Encounter (Signed)
Patient is requesting to have her labs rechecked.. says she would like to check her blood sugar as well as cholesterol   Please fu [phone]

## 2021-04-09 NOTE — Telephone Encounter (Signed)
Called patient. LDVM with Dr. Nathanial Millman recommendations. Office number was provided in case she has additional questions or concerns.

## 2021-09-03 DIAGNOSIS — Z1231 Encounter for screening mammogram for malignant neoplasm of breast: Secondary | ICD-10-CM | POA: Diagnosis not present

## 2021-09-22 ENCOUNTER — Ambulatory Visit: Payer: Medicare Other | Admitting: Internal Medicine

## 2022-01-06 DIAGNOSIS — I10 Essential (primary) hypertension: Secondary | ICD-10-CM | POA: Diagnosis not present

## 2022-01-06 DIAGNOSIS — E785 Hyperlipidemia, unspecified: Secondary | ICD-10-CM | POA: Diagnosis not present

## 2022-01-06 DIAGNOSIS — I201 Angina pectoris with documented spasm: Secondary | ICD-10-CM | POA: Diagnosis not present

## 2022-01-08 DIAGNOSIS — I10 Essential (primary) hypertension: Secondary | ICD-10-CM | POA: Diagnosis not present

## 2022-01-08 DIAGNOSIS — E785 Hyperlipidemia, unspecified: Secondary | ICD-10-CM | POA: Diagnosis not present

## 2022-02-11 ENCOUNTER — Encounter: Payer: Self-pay | Admitting: Internal Medicine

## 2022-02-11 ENCOUNTER — Ambulatory Visit (INDEPENDENT_AMBULATORY_CARE_PROVIDER_SITE_OTHER): Payer: Medicare Other | Admitting: Internal Medicine

## 2022-02-11 VITALS — BP 112/80 | HR 74 | Temp 98.1°F | Ht 63.5 in | Wt 108.0 lb

## 2022-02-11 DIAGNOSIS — C770 Secondary and unspecified malignant neoplasm of lymph nodes of head, face and neck: Secondary | ICD-10-CM

## 2022-02-11 DIAGNOSIS — E7800 Pure hypercholesterolemia, unspecified: Secondary | ICD-10-CM | POA: Diagnosis not present

## 2022-02-11 DIAGNOSIS — R7301 Impaired fasting glucose: Secondary | ICD-10-CM | POA: Diagnosis not present

## 2022-02-11 DIAGNOSIS — Z0000 Encounter for general adult medical examination without abnormal findings: Secondary | ICD-10-CM

## 2022-02-11 DIAGNOSIS — R636 Underweight: Secondary | ICD-10-CM

## 2022-02-11 DIAGNOSIS — I10 Essential (primary) hypertension: Secondary | ICD-10-CM | POA: Diagnosis not present

## 2022-02-11 NOTE — Progress Notes (Signed)
Subjective:   Patient ID: Brandy Ramos, female    DOB: 04-11-1951, 71 y.o.   MRN: [id number]  HPI Here for medicare wellness and physical, no new complaints. Please see A/P for status and treatment of chronic medical problems.   Diet: heart healthy Physical activity: sedentary Depression/mood screen: negative Hearing: intact to whispered voice Visual acuity: grossly normal, performs annual eye exam  ADLs: capable Fall risk: none Home safety: good Cognitive evaluation: intact to orientation, naming, recall and repetition EOL planning: adv directives discussed  [facility] Visit from 02/11/2022 in [place] at [facility]  PHQ-2 Total Score [place] Office Visit from 01/16/2020 in [place]  PHQ-9 Total Score 0         10/29/2014    2:52 PM 07/06/2017    8:42 AM 01/16/2020   10:56 AM 12/09/2020   10:08 AM 02/11/2022   10:39 AM  Fall Risk  Falls in the past year? Yes No 0 1 1  Was there an injury with Fall? No  0 0 0  Fall Risk Category Calculator   0 1 1  Fall Risk Category   Low Low Low  Patient Fall Risk Level   Low fall risk Low fall risk Low fall risk  Patient at Risk for Falls Due to Impaired balance/gait  No Fall Risks No Fall Risks No Fall Risks  Fall risk Follow up   Falls evaluation completed;Education provided Falls evaluation completed Falls evaluation completed    I have personally reviewed and have noted 1. The patient's medical and social history - reviewed today no changes 2. Their use of alcohol, tobacco or illicit drugs 3. Their current medications and supplements 4. The patient's functional ability including ADL's, fall risks, home safety risks and hearing or visual impairment. 5. Diet and physical activities 6. Evidence for depression or mood disorders 7. Care team reviewed and updated 8.  The patient is not on an opioid pain medication.  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   DEPRESSION 11/04/2007   History of radiation therapy 01/2013   Gamma Knife extend 32 Gy in 4 fractions to a right retropharyngeal lymph node   History of radiation therapy 11/08/2015   GK extend 32 gy in 4 fractions to Left Retropharyngeal LN   HYPERTENSION 12/11/2008   INSOMNIA 06/16/2007   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   OSTEOPOROSIS 11/04/2007   S/P 5 years of Fosamax therapy   Palpitations 07/11/2008   S/P radiation > 12 weeks 05/30/2009-07/17/09   completed radiation therapy January 2011   S/P radiation therapy greater than twelve weeks ago 07/04/12-08/18/12   r neck& draining lymph nodes,59.4Gy/33f   SHOULDER PAIN, RIGHT 05/29/2008   Past Surgical History:  Procedure Laterality Date   CESAREAN SECTION     LEFT HEART CATH AND CORONARY ANGIOGRAPHY N/A 08/25/2016   Procedure: Left Heart Cath and Coronary Angiography;  Surgeon: MCharolette Forward MD;  Location: [facility];  Service: Cardiovascular;  Laterality: N/A;   right partial maxillectomy  02/2009   right selective neck dissection  05/23/2012  SKIN GRAFT  2010   right maxillectomy   Family History  Problem Relation Age of Onset   Heart disease Mother    Hypertension Mother    Cancer Father    Breast cancer Sister    Review of Systems  Constitutional: Negative.   HENT: Negative.    Eyes: Negative.   Respiratory:  Negative for cough, chest tightness and shortness of breath.   Cardiovascular:  Negative for chest pain, palpitations and leg swelling.  Gastrointestinal:  Negative for abdominal distention, abdominal pain,  constipation, diarrhea, nausea and vomiting.  Musculoskeletal: Negative.   Skin: Negative.   Neurological: Negative.   Psychiatric/Behavioral: Negative.      Objective:  Physical Exam Constitutional:      Appearance: She is well-developed.  HENT:     Head: Normocephalic and atraumatic.  Cardiovascular:     Rate and Rhythm: Normal rate and regular rhythm.  Pulmonary:     Effort: Pulmonary effort is normal. No respiratory distress.     Breath sounds: Normal breath sounds. No wheezing or rales.  Abdominal:     General: Bowel sounds are normal. There is no distension.     Palpations: Abdomen is soft.     Tenderness: There is no abdominal tenderness. There is no rebound.  Musculoskeletal:     Cervical back: Normal range of motion.  Skin:    General: Skin is warm and dry.  Neurological:     Mental Status: She is alert and oriented to person, place, and time.     Coordination: Coordination normal.     Vitals:   02/11/22 1033  BP: 112/80  Pulse: 74  Temp: 98.1 F (36.7 C)  TempSrc: Oral  SpO2: 99%  Weight: 108 lb (49 kg)  Height: 5' 3.5" (1.613 m)    Assessment & Plan:

## 2022-02-13 ENCOUNTER — Other Ambulatory Visit: Payer: Self-pay | Admitting: Internal Medicine

## 2022-02-13 ENCOUNTER — Other Ambulatory Visit (INDEPENDENT_AMBULATORY_CARE_PROVIDER_SITE_OTHER): Payer: Medicare Other

## 2022-02-13 DIAGNOSIS — E7800 Pure hypercholesterolemia, unspecified: Secondary | ICD-10-CM

## 2022-02-13 DIAGNOSIS — I10 Essential (primary) hypertension: Secondary | ICD-10-CM

## 2022-02-13 DIAGNOSIS — R7301 Impaired fasting glucose: Secondary | ICD-10-CM | POA: Insufficient documentation

## 2022-02-13 DIAGNOSIS — R636 Underweight: Secondary | ICD-10-CM

## 2022-02-13 LAB — COMPREHENSIVE METABOLIC PANEL
ALT: 11 U/L (ref 0–35)
AST: 17 U/L (ref 0–37)
Albumin: 4.1 g/dL (ref 3.5–5.2)
Alkaline Phosphatase: 57 U/L (ref 39–117)
BUN: 18 mg/dL (ref 6–23)
CO2: 27 mEq/L (ref 19–32)
Calcium: 9.3 mg/dL (ref 8.4–10.5)
Chloride: 106 mEq/L (ref 96–112)
Creatinine, Ser: 0.89 mg/dL (ref 0.40–1.20)
GFR: 64.88 mL/min (ref >60.00)
Glucose, Bld: 81 mg/dL (ref 70–99)
Potassium: 4.1 mEq/L (ref 3.5–5.1)
Sodium: 140 mEq/L (ref 135–145)
Total Bilirubin: 0.5 mg/dL (ref 0.2–1.2)
Total Protein: 6.9 g/dL (ref 6.0–8.3)

## 2022-02-13 LAB — CBC WITH DIFFERENTIAL/PLATELET
Basophils Absolute: 0 10*3/uL (ref 0.0–0.1)
Basophils Relative: 0.5 % (ref 0.0–3.0)
Eosinophils Absolute: 0 10*3/uL (ref 0.0–0.7)
Eosinophils Relative: 0.9 % (ref 0.0–5.0)
HCT: 38.3 % (ref 36.0–46.0)
Hemoglobin: 12.1 g/dL (ref 12.0–15.0)
Lymphocytes Relative: 14.9 % (ref 12.0–46.0)
Lymphs Abs: 0.8 10*3/uL (ref 0.7–4.0)
MCHC: 31.6 g/dL (ref 30.0–36.0)
MCV: 70.4 fl — ABNORMAL LOW (ref 78.0–100.0)
Monocytes Absolute: 0.5 10*3/uL (ref 0.1–1.0)
Monocytes Relative: 9.7 % (ref 3.0–12.0)
Neutro Abs: 3.8 10*3/uL (ref 1.4–7.7)
Neutrophils Relative %: 74 % (ref 43.0–77.0)
Platelets: 215 10*3/uL (ref 150.0–400.0)
RBC: 5.44 Mil/uL — ABNORMAL HIGH (ref 3.87–5.11)
RDW: 15.9 % — ABNORMAL HIGH (ref 11.5–15.5)
WBC: 5.2 10*3/uL (ref 4.0–10.5)

## 2022-02-13 LAB — LIPID PANEL
Cholesterol: 235 mg/dL — ABNORMAL HIGH (ref 0–200)
HDL: 74.4 mg/dL (ref >39.00)
LDL Cholesterol: 152 mg/dL — ABNORMAL HIGH (ref 0–99)
NonHDL: 160.4
Total CHOL/HDL Ratio: 3
Triglycerides: 42 mg/dL (ref 0.0–149.0)
VLDL: 8.4 mg/dL (ref 0.0–40.0)

## 2022-02-13 LAB — TSH: TSH: 1.88 u[IU]/mL (ref 0.35–5.50)

## 2022-02-13 LAB — HEMOGLOBIN A1C: Hgb A1c MFr Bld: 6.1 % (ref 4.6–6.5)

## 2022-02-13 NOTE — Assessment & Plan Note (Signed)
BP at goal on amlodipine 5 mg daily and will continue. Checking CMP.

## 2022-02-13 NOTE — Assessment & Plan Note (Signed)
Still under care of oncology for monitoring.

## 2022-02-13 NOTE — Assessment & Plan Note (Signed)
Weight has dropped some since last year and she has been working more. She is going to intentionally increase protein intake and calorie intake.

## 2022-02-13 NOTE — Assessment & Plan Note (Signed)
Checking HgA1c for monitoring.  

## 2022-02-13 NOTE — Assessment & Plan Note (Signed)
Checking lipid panel and adjust as needed. Not on medication currently.

## 2022-02-13 NOTE — Assessment & Plan Note (Signed)
Flu shot yearly. Covid-19 counseled. Pneumonia declines. Shingrix declines. Tetanus due 2024. Colonoscopy due 2024. Mammogram due 2024, pap smear aged out and dexa up to date with gyn. Counseled about sun safety and mole surveillance. Counseled about the dangers of distracted driving. Given 10 year screening recommendations.

## 2022-02-20 ENCOUNTER — Telehealth: Payer: Self-pay

## 2022-02-20 NOTE — Telephone Encounter (Signed)
LMTCB

## 2022-02-20 NOTE — Telephone Encounter (Signed)
Pt is requesting a call back in reference to her recent lab results.  Please advise

## 2022-02-24 NOTE — Telephone Encounter (Signed)
Spoke with pt  about her labs and Dr. Nathanial Millman advice. Pt stated she understands and has no questions or concerns.  **Pt stated she will try the diet change to see if it helps with her numbers before agreeing to taking meds at this time.

## 2022-03-09 ENCOUNTER — Telehealth: Payer: Self-pay | Admitting: Internal Medicine

## 2022-03-09 NOTE — Telephone Encounter (Signed)
Please advise 

## 2022-03-09 NOTE — Telephone Encounter (Signed)
Patient needs a prescription for a glucose monitor and test strips.  Please send to [facility] on [address] in [place]

## 2022-03-10 NOTE — Telephone Encounter (Signed)
She does not have diabetes so does not need to monitor sugars.

## 2022-03-10 NOTE — Telephone Encounter (Signed)
Notified pt --regarding Dr. Sharlet Salina message. Pt voiced understanding.

## 2022-04-17 DIAGNOSIS — H2513 Age-related nuclear cataract, bilateral: Secondary | ICD-10-CM | POA: Diagnosis not present

## 2022-04-17 DIAGNOSIS — H10413 Chronic giant papillary conjunctivitis, bilateral: Secondary | ICD-10-CM | POA: Diagnosis not present

## 2022-04-17 DIAGNOSIS — H40013 Open angle with borderline findings, low risk, bilateral: Secondary | ICD-10-CM | POA: Diagnosis not present

## 2022-05-11 IMAGING — DX DG HIP (WITH OR WITHOUT PELVIS) 2-3V*L*
3 series · 3 of 3 positions shown · non-contrast
Comparison: None.

CLINICAL DATA: Left leg pain

EXAM:
DG HIP (WITH OR WITHOUT PELVIS) 2-3V LEFT

[pelvis ap]
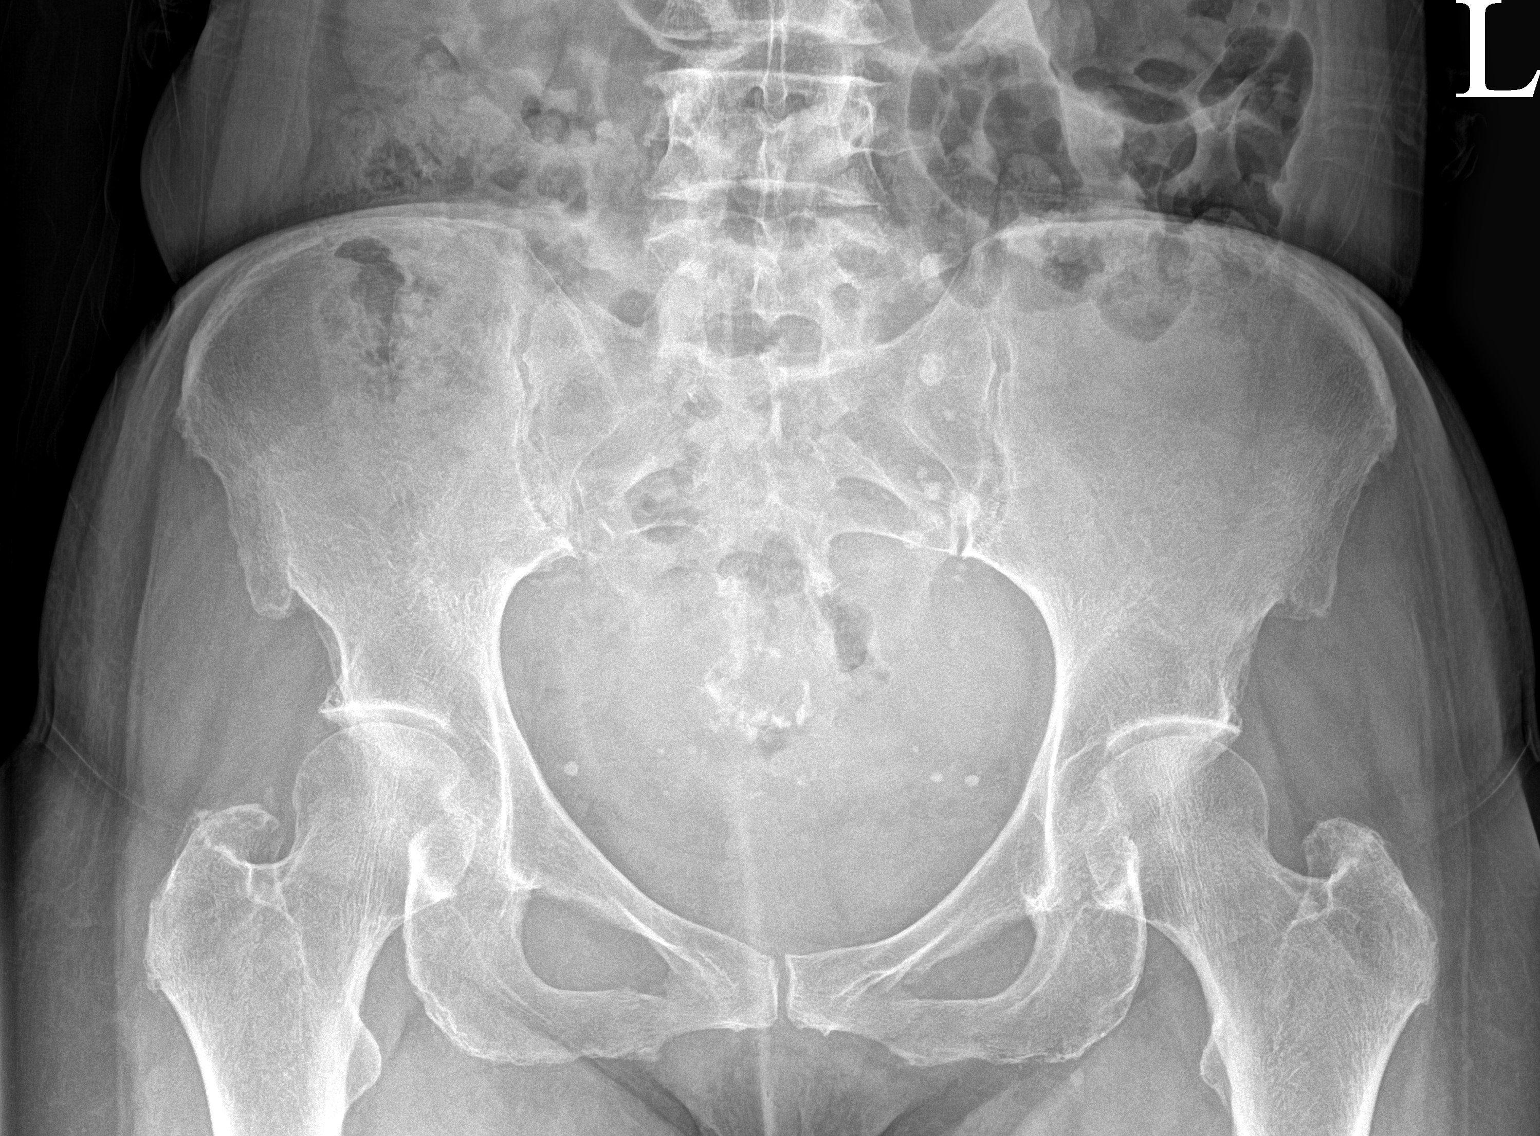

[hip ap]
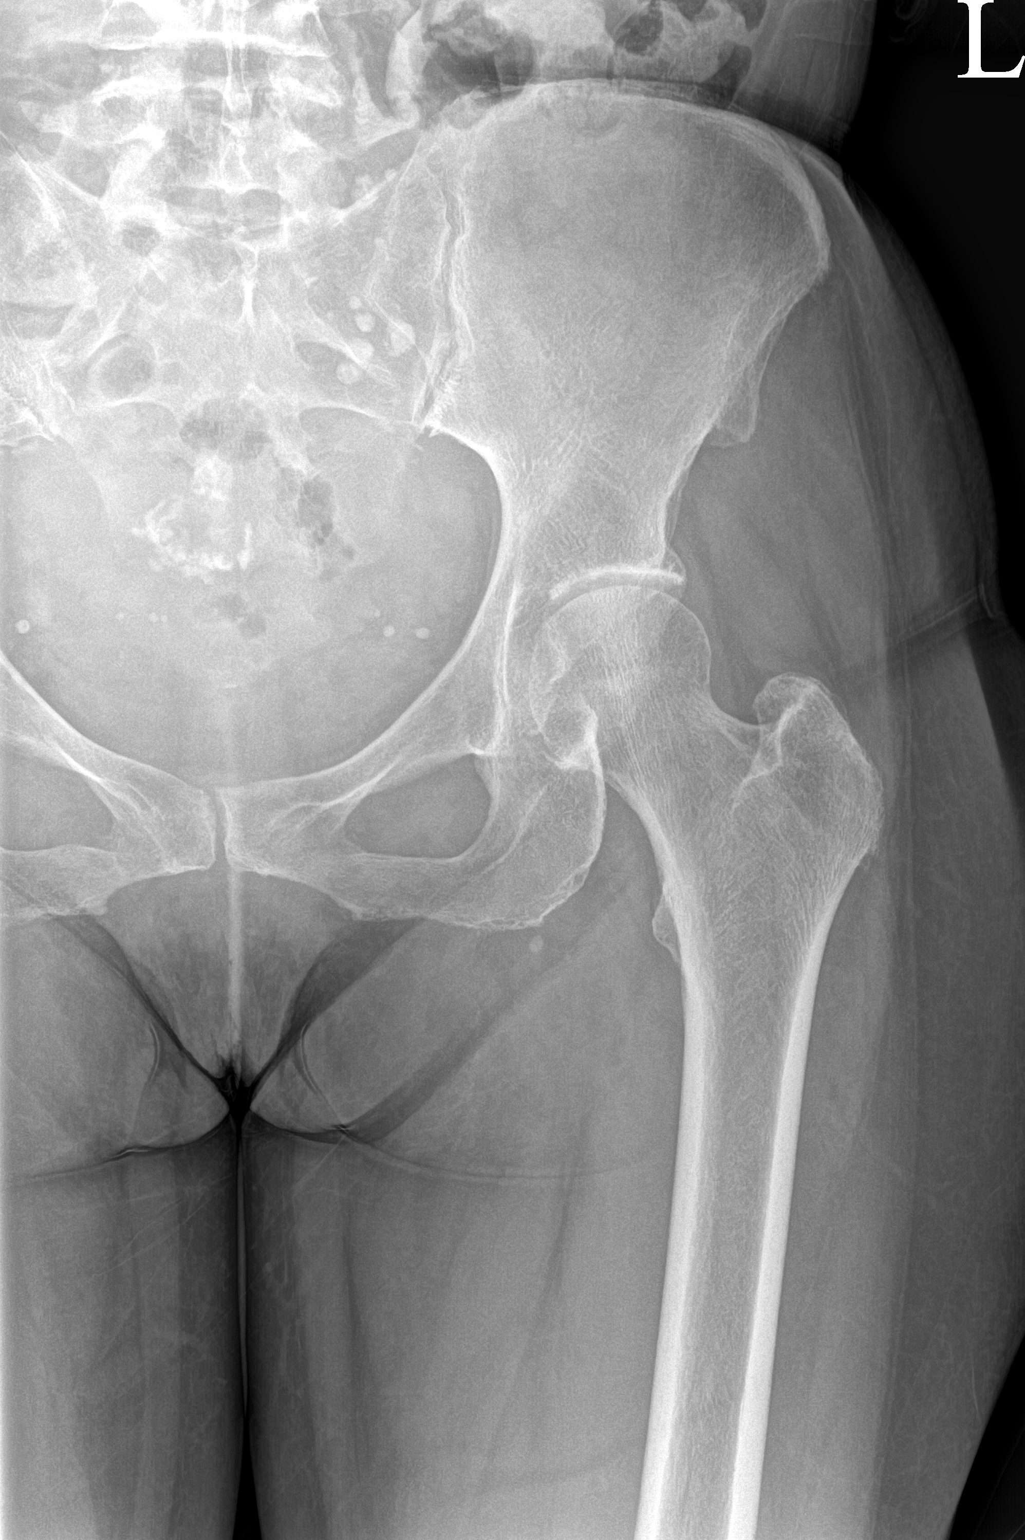

[hip frog leg]
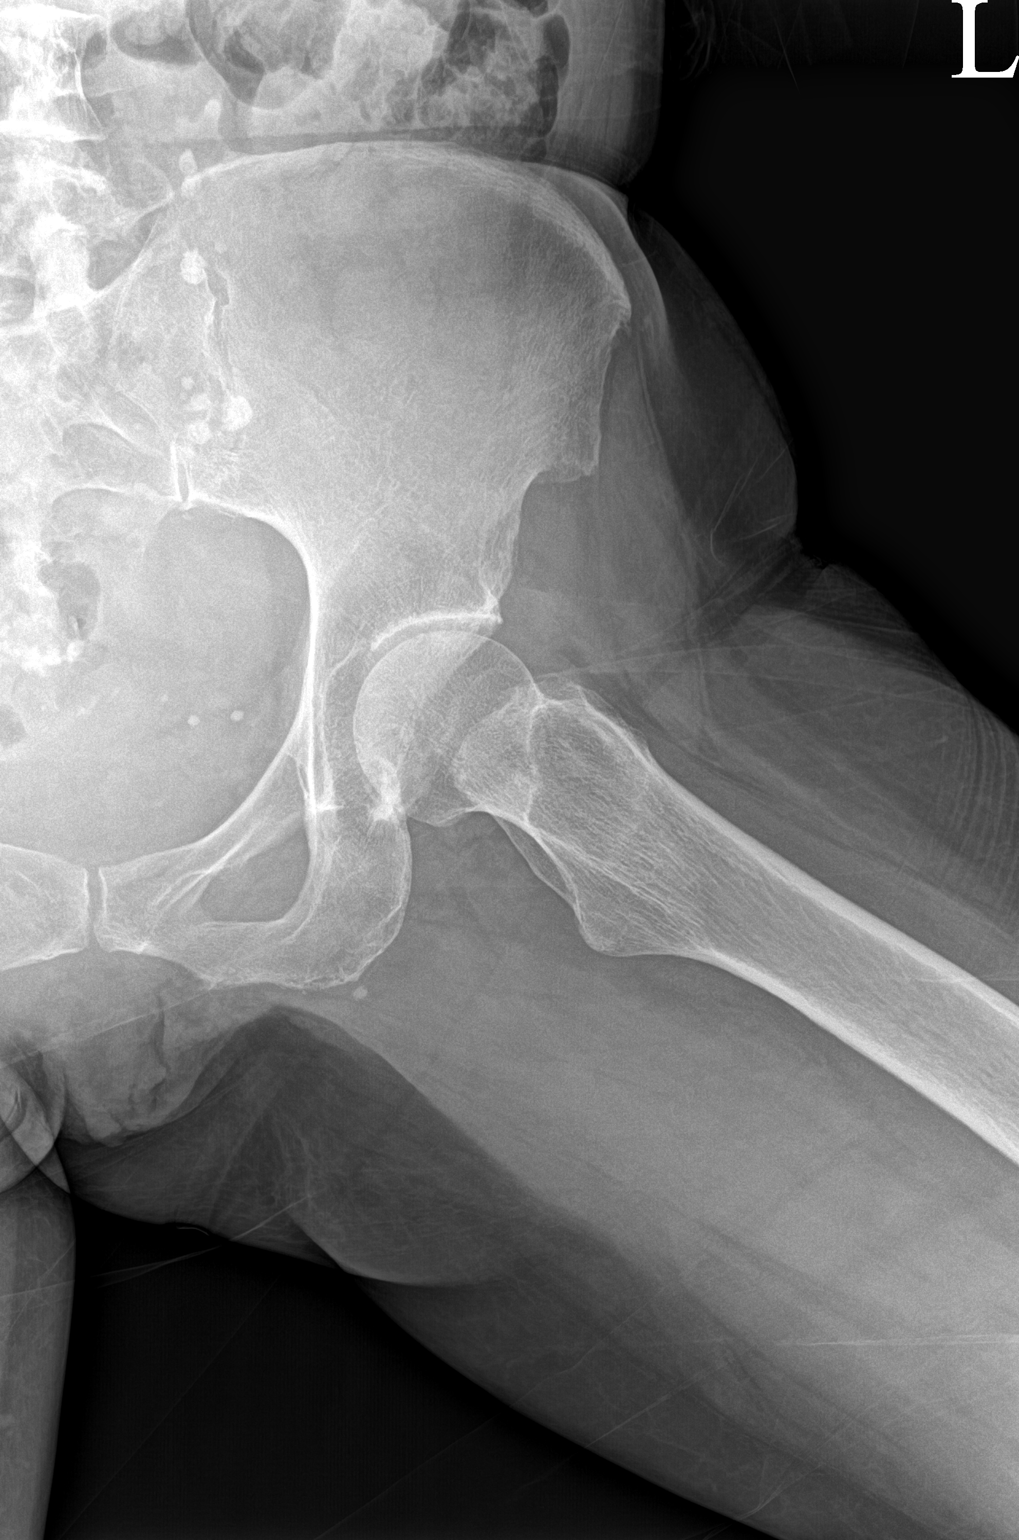

[3 of 3 positions shown; findings below may reference images not displayed]

FINDINGS: Hip joints and SI joints are symmetric and unremarkable. No acute
bony abnormality. Specifically, no fracture, subluxation, or
dislocation.
IMPRESSION: No acute bony abnormality.

## 2022-05-11 IMAGING — DX DG KNEE COMPLETE 4+V*L*
4 series · 4 of 4 positions shown · non-contrast
Comparison: None.

CLINICAL DATA: Left leg pain

EXAM:
LEFT KNEE - COMPLETE 4+ VIEW

[knee ap]
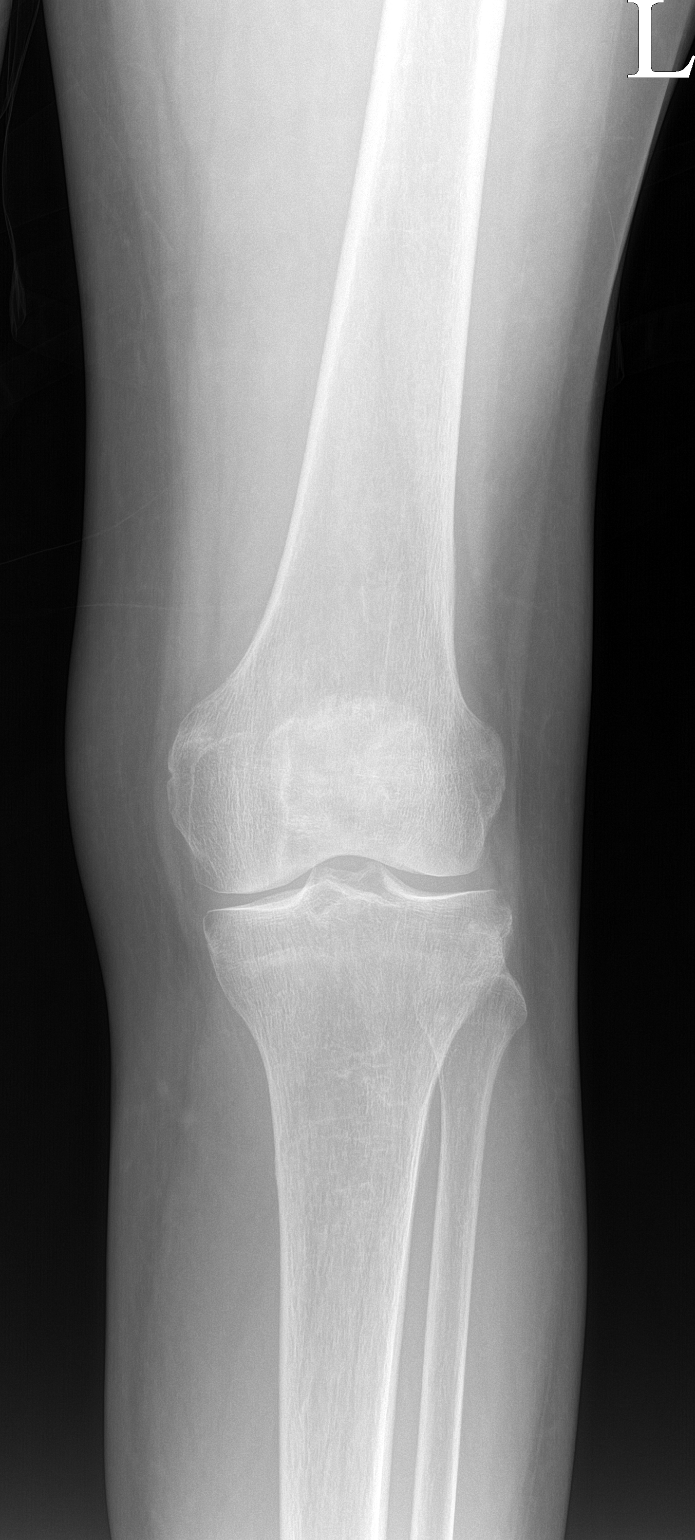

[knee obl (1 of 2)]
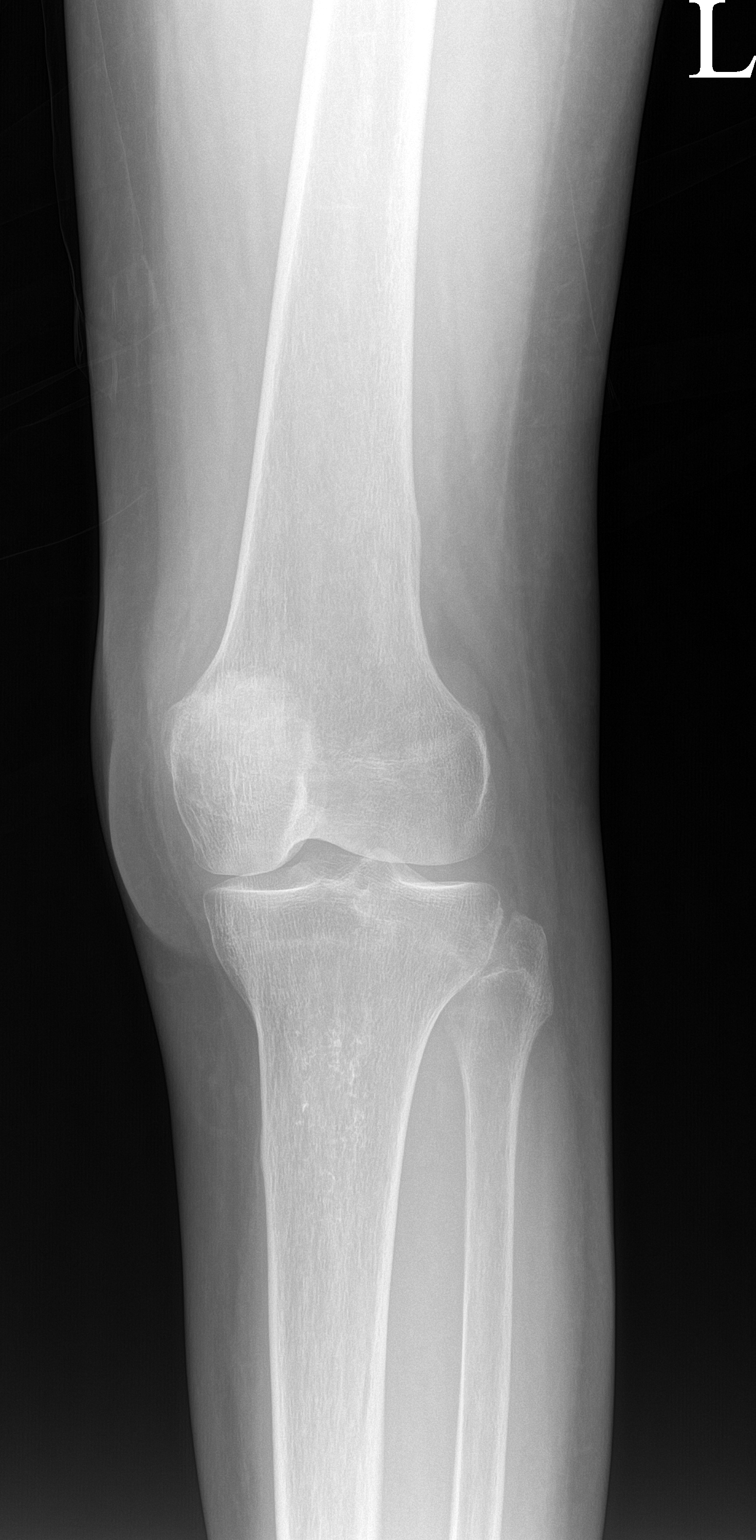

[knee obl (2 of 2)]
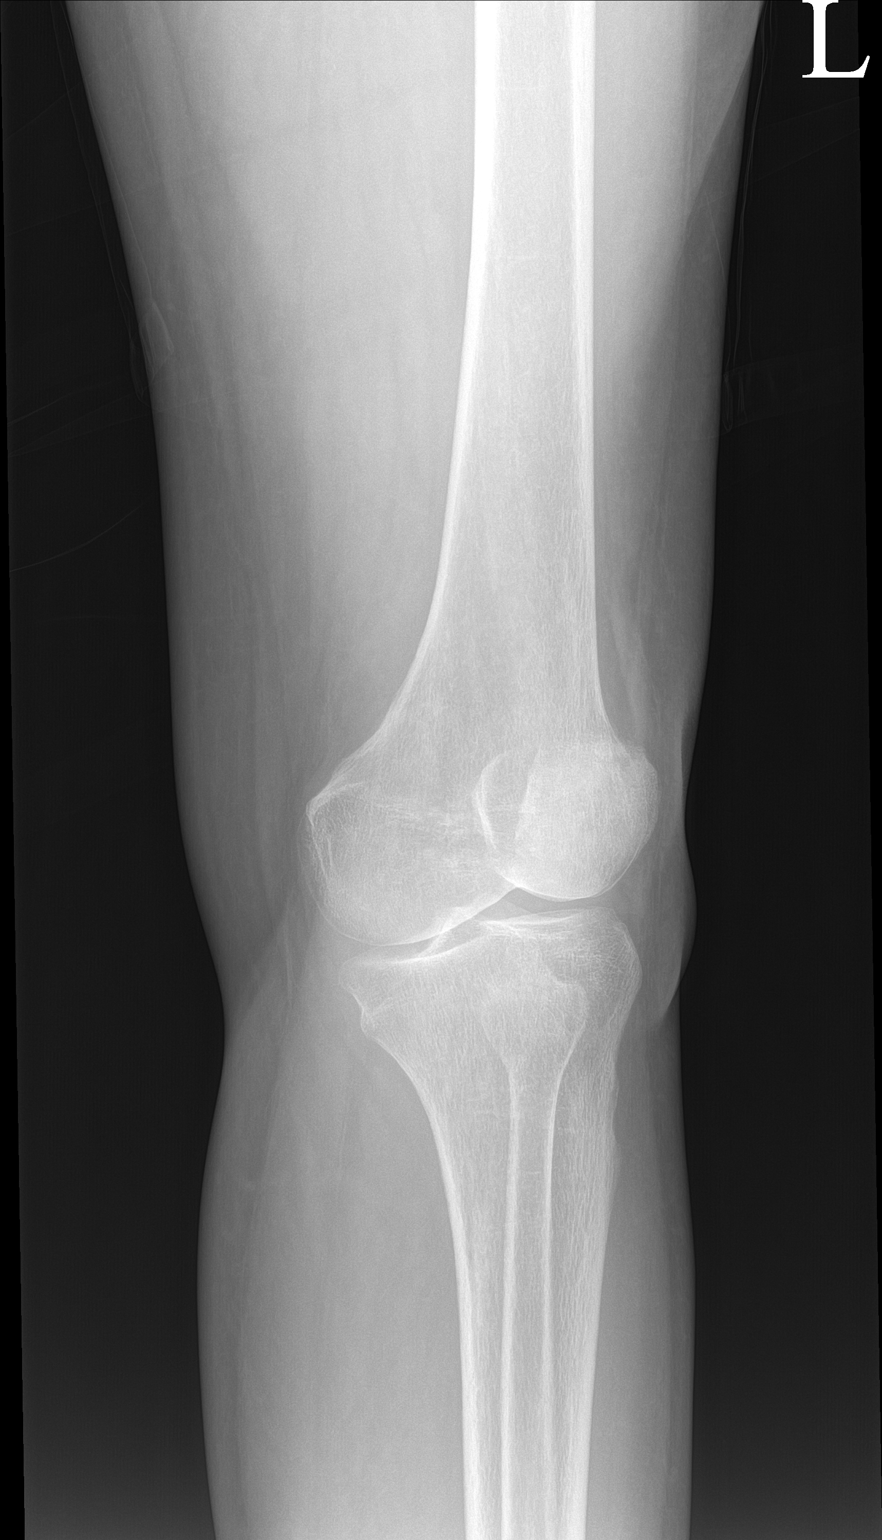

[knee lat]
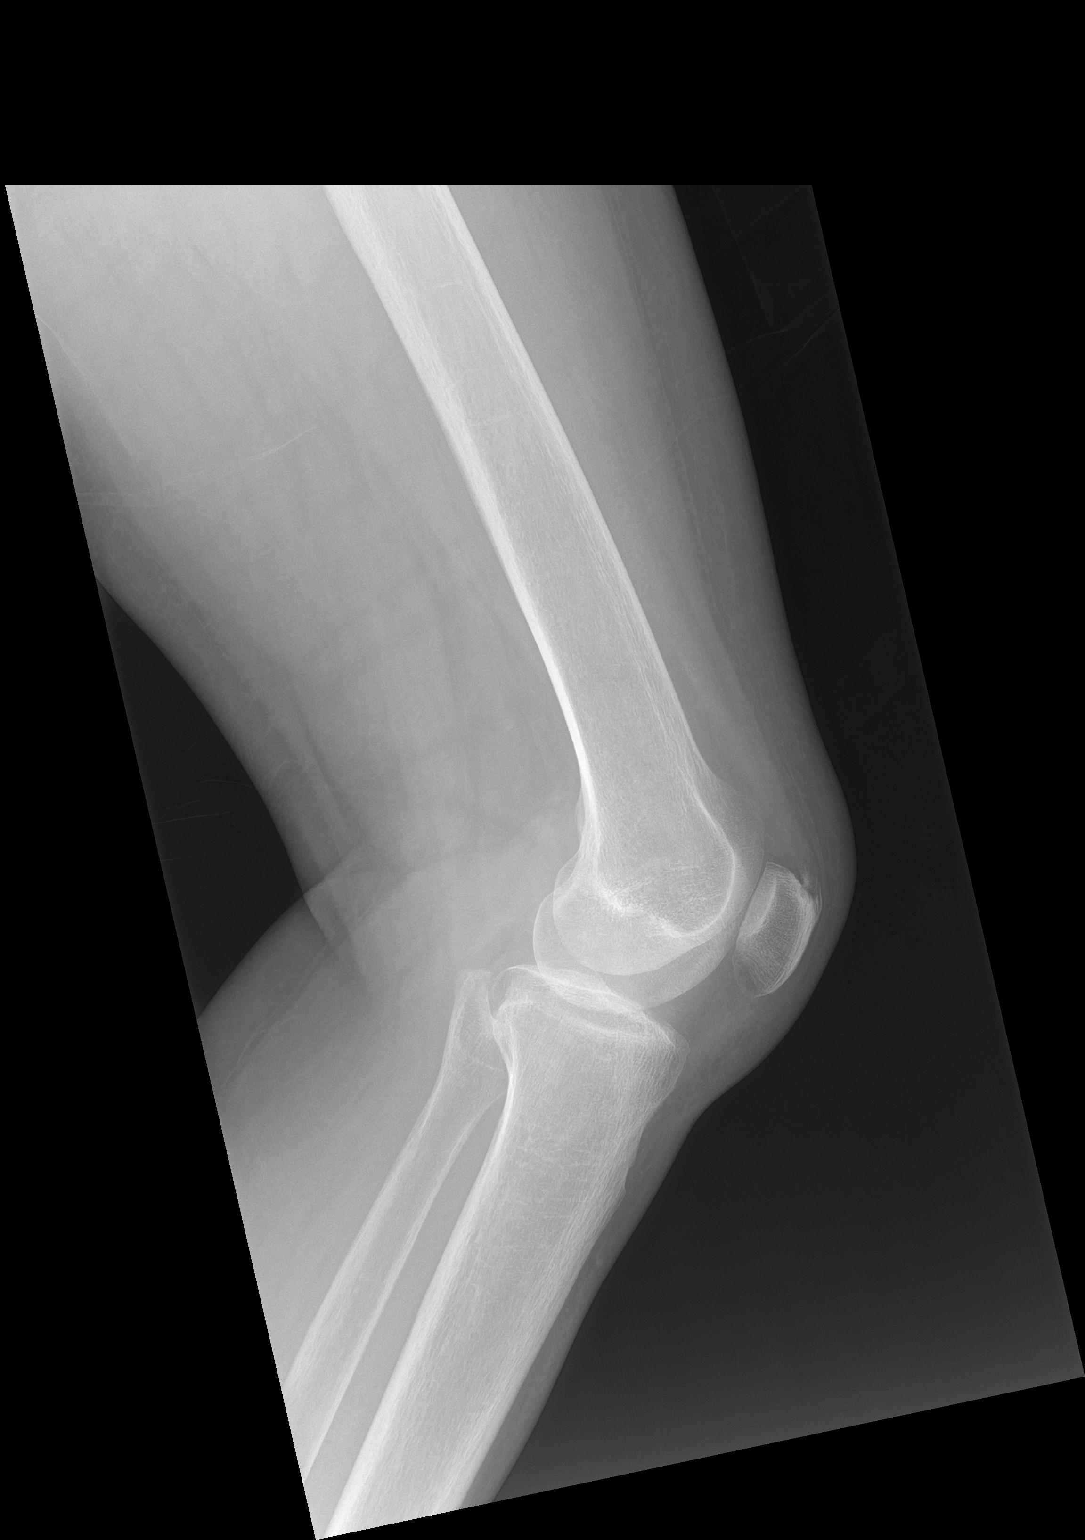

[4 of 4 positions shown; findings below may reference images not displayed]

FINDINGS: No evidence of fracture, dislocation, or joint effusion. No evidence
of arthropathy or other focal bone abnormality. Soft tissues are
unremarkable.
IMPRESSION: Negative.

## 2022-07-08 DIAGNOSIS — E785 Hyperlipidemia, unspecified: Secondary | ICD-10-CM | POA: Diagnosis not present

## 2022-07-08 DIAGNOSIS — R7303 Prediabetes: Secondary | ICD-10-CM | POA: Diagnosis not present

## 2022-07-08 DIAGNOSIS — I201 Angina pectoris with documented spasm: Secondary | ICD-10-CM | POA: Diagnosis not present

## 2022-07-08 DIAGNOSIS — I10 Essential (primary) hypertension: Secondary | ICD-10-CM | POA: Diagnosis not present

## 2022-07-17 ENCOUNTER — Encounter: Payer: Self-pay | Admitting: Internal Medicine

## 2022-07-17 ENCOUNTER — Ambulatory Visit (INDEPENDENT_AMBULATORY_CARE_PROVIDER_SITE_OTHER): Payer: Medicare Other | Admitting: Internal Medicine

## 2022-07-17 VITALS — BP 160/100 | HR 73 | Temp 97.3°F | Ht 63.5 in | Wt 113.0 lb

## 2022-07-17 DIAGNOSIS — R7301 Impaired fasting glucose: Secondary | ICD-10-CM

## 2022-07-17 DIAGNOSIS — E7800 Pure hypercholesterolemia, unspecified: Secondary | ICD-10-CM

## 2022-07-17 DIAGNOSIS — I10 Essential (primary) hypertension: Secondary | ICD-10-CM | POA: Diagnosis not present

## 2022-07-17 LAB — LIPID PANEL
Cholesterol: 255 mg/dL — ABNORMAL HIGH (ref 0–200)
HDL: 80 mg/dL (ref >39.00)
LDL Cholesterol: 166 mg/dL — ABNORMAL HIGH (ref 0–99)
NonHDL: 175.4
Total CHOL/HDL Ratio: 3
Triglycerides: 45 mg/dL (ref 0.0–149.0)
VLDL: 9 mg/dL (ref 0.0–40.0)

## 2022-07-17 LAB — COMPREHENSIVE METABOLIC PANEL
ALT: 12 U/L (ref 0–35)
AST: 15 U/L (ref 0–37)
Albumin: 4 g/dL (ref 3.5–5.2)
Alkaline Phosphatase: 59 U/L (ref 39–117)
BUN: 14 mg/dL (ref 6–23)
CO2: 30 mEq/L (ref 19–32)
Calcium: 9.2 mg/dL (ref 8.4–10.5)
Chloride: 104 mEq/L (ref 96–112)
Creatinine, Ser: 0.87 mg/dL (ref 0.40–1.20)
GFR: 66.47 mL/min (ref >60.00)
Glucose, Bld: 93 mg/dL (ref 70–99)
Potassium: 3.9 mEq/L (ref 3.5–5.1)
Sodium: 142 mEq/L (ref 135–145)
Total Bilirubin: 0.4 mg/dL (ref 0.2–1.2)
Total Protein: 6.9 g/dL (ref 6.0–8.3)

## 2022-07-17 LAB — CBC
HCT: 40.3 % (ref 36.0–46.0)
Hemoglobin: 12.9 g/dL (ref 12.0–15.0)
MCHC: 31.9 g/dL (ref 30.0–36.0)
MCV: 69.7 fl — ABNORMAL LOW (ref 78.0–100.0)
Platelets: 212 10*3/uL (ref 150.0–400.0)
RBC: 5.77 Mil/uL — ABNORMAL HIGH (ref 3.87–5.11)
RDW: 16 % — ABNORMAL HIGH (ref 11.5–15.5)
WBC: 4.4 10*3/uL (ref 4.0–10.5)

## 2022-07-17 LAB — HEMOGLOBIN A1C: Hgb A1c MFr Bld: 6 % (ref 4.6–6.5)

## 2022-07-17 MED ORDER — AMLODIPINE BESYLATE 5 MG PO TABS
ORAL_TABLET | ORAL | 3 refills | Status: DC
Start: 2022-07-17 — End: 2022-07-17

## 2022-07-17 MED ORDER — AMLODIPINE BESYLATE 5 MG PO TABS: 5.0000 mg | ORAL_TABLET | Freq: Every day | ORAL | 3 refills | Status: DC

## 2022-07-17 NOTE — Patient Instructions (Signed)
We will check the labs today.    

## 2022-07-17 NOTE — Assessment & Plan Note (Signed)
BP is elevated on her amlodipine 5 mg daily. Her cardiologist just prescribed losartan 50 mg daily and she has not started yet. Encouraged her to start this as her BP is moderately high today. Checking CMP and CBC and adjust as needed.

## 2022-07-17 NOTE — Assessment & Plan Note (Signed)
Extensive counseling about pre-diabetes and checking HgA1c today. Adjust as needed.

## 2022-07-17 NOTE — Assessment & Plan Note (Signed)
Recently prescribed crestor 10 mg daily by cardiologist but has not started yet. Checking lipid panel and likely will advise to start crestor 10 mg daily.

## 2022-07-17 NOTE — Progress Notes (Signed)
   Subjective:   Patient ID: Brandy Ramos, female    DOB: 04-Oct-1950, 72 y.o.   MRN: [id number]  HPI The patient is a 72 YO female coming in for follow up  Review of Systems  Constitutional: Negative.   HENT: Negative.    Eyes: Negative.   Respiratory:  Negative for cough, chest tightness and shortness of breath.   Cardiovascular:  Negative for chest pain, palpitations and leg swelling.  Gastrointestinal:  Negative for abdominal distention, abdominal pain, constipation, diarrhea, nausea and vomiting.  Musculoskeletal: Negative.   Skin: Negative.   Neurological: Negative.   Psychiatric/Behavioral: Negative.      Objective:  Physical Exam Constitutional:      Appearance: She is well-developed.  HENT:     Head: Normocephalic and atraumatic.  Cardiovascular:     Rate and Rhythm: Normal rate and regular rhythm.  Pulmonary:     Effort: Pulmonary effort is normal. No respiratory distress.     Breath sounds: Normal breath sounds. No wheezing or rales.  Abdominal:     General: Bowel sounds are normal. There is no distension.     Palpations: Abdomen is soft.     Tenderness: There is no abdominal tenderness. There is no rebound.  Musculoskeletal:     Cervical back: Normal range of motion.  Skin:    General: Skin is warm and dry.  Neurological:     Mental Status: She is alert and oriented to person, place, and time.     Coordination: Coordination normal.     Vitals:   07/17/22 1013 07/17/22 1018  BP: (!) 160/100 (!) 160/100  Pulse: 73   Temp: (!) 97.3 F (36.3 C)   TempSrc: Oral   SpO2: 98%   Weight: 113 lb (51.3 kg)   Height: 5' 3.5" (1.613 m)     Assessment & Plan:

## 2022-09-09 DIAGNOSIS — Z1231 Encounter for screening mammogram for malignant neoplasm of breast: Secondary | ICD-10-CM | POA: Diagnosis not present

## 2022-09-09 LAB — HM MAMMOGRAPHY

## 2022-10-07 ENCOUNTER — Telehealth: Payer: Self-pay | Admitting: Internal Medicine

## 2022-10-07 NOTE — Telephone Encounter (Signed)
Patient would like to know if she can have labs ordered by Dr. Okey Dupre. She wants to check and see how her A1C is doing. She would like a callback if orders are out in. Best callback is [phone].

## 2022-10-08 NOTE — Telephone Encounter (Signed)
Called pt back to inform her

## 2022-10-08 NOTE — Telephone Encounter (Signed)
We had recommended follow up of this in 6 month back in Jan.

## 2022-10-13 ENCOUNTER — Ambulatory Visit (INDEPENDENT_AMBULATORY_CARE_PROVIDER_SITE_OTHER): Payer: Medicare Other | Admitting: Internal Medicine

## 2022-10-13 ENCOUNTER — Ambulatory Visit (INDEPENDENT_AMBULATORY_CARE_PROVIDER_SITE_OTHER)
Admission: RE | Admit: 2022-10-13 | Discharge: 2022-10-13 | Disposition: A | Discharge status: Home / Self Care | Payer: Medicare Other | Source: Ambulatory Visit | Attending: Internal Medicine | Admitting: Internal Medicine

## 2022-10-13 ENCOUNTER — Encounter: Payer: Self-pay | Admitting: Internal Medicine

## 2022-10-13 VITALS — BP 100/80 | HR 69 | Temp 98.2°F | Ht 63.5 in | Wt 106.0 lb

## 2022-10-13 DIAGNOSIS — M545 Low back pain, unspecified: Secondary | ICD-10-CM | POA: Diagnosis not present

## 2022-10-13 DIAGNOSIS — R29898 Other symptoms and signs involving the musculoskeletal system: Secondary | ICD-10-CM | POA: Diagnosis not present

## 2022-10-13 DIAGNOSIS — J301 Allergic rhinitis due to pollen: Secondary | ICD-10-CM | POA: Diagnosis not present

## 2022-10-13 DIAGNOSIS — M4126 Other idiopathic scoliosis, lumbar region: Secondary | ICD-10-CM | POA: Diagnosis not present

## 2022-10-13 MED ORDER — FLUTICASONE PROPIONATE 50 MCG/ACT NA SUSP: 2.0000 | Freq: Every day | NASAL | 6 refills | Status: DC

## 2022-10-13 NOTE — Patient Instructions (Addendum)
We have sent in the flonase to use 2 sprays in each nostril once a day to help with the pollen.  We can check the low back to see if there is a problem.

## 2022-10-13 NOTE — Progress Notes (Unsigned)
   Subjective:   Patient ID: Brandy Ramos, female    DOB: 1951/05/19, 72 y.o.   MRN: [id number]  HPI The patient is a 72 YO female coming in for sinus issues 3 weeks.   Review of Systems  Objective:  Physical Exam  Vitals:   10/13/22 0955  BP: 100/80  Pulse: 69  Temp: 98.2 F (36.8 C)  TempSrc: Oral  SpO2: 98%  Weight: 106 lb (48.1 kg)  Height: 5' 3.5" (1.613 m)    Assessment & Plan:

## 2022-10-15 ENCOUNTER — Encounter: Payer: Self-pay | Admitting: Internal Medicine

## 2022-10-15 DIAGNOSIS — R29898 Other symptoms and signs involving the musculoskeletal system: Secondary | ICD-10-CM | POA: Insufficient documentation

## 2022-10-15 DIAGNOSIS — J309 Allergic rhinitis, unspecified: Secondary | ICD-10-CM | POA: Insufficient documentation

## 2022-10-15 NOTE — Assessment & Plan Note (Signed)
More pain and clumsiness in left leg. Checking lumbar xray today.

## 2022-10-15 NOTE — Assessment & Plan Note (Signed)
Rx flonase to help with symptoms.

## 2023-01-12 ENCOUNTER — Ambulatory Visit (INDEPENDENT_AMBULATORY_CARE_PROVIDER_SITE_OTHER): Payer: Medicare Other

## 2023-01-12 VITALS — Ht 63.5 in | Wt 106.0 lb

## 2023-01-12 DIAGNOSIS — Z0000 Encounter for general adult medical examination without abnormal findings: Secondary | ICD-10-CM | POA: Diagnosis not present

## 2023-01-12 NOTE — Progress Notes (Signed)
Subjective:   Brandy Ramos is a 72 y.o. female who presents for Medicare Annual (Subsequent) preventive examination.  Visit Complete: Virtual  I connected with  Brandy Ramos on 01/12/23 by a audio enabled telemedicine application and verified that I am speaking with the correct person using two identifiers.  Patient Location: Home  Provider Location: Office/Clinic  I discussed the limitations of evaluation and management by telemedicine. The patient expressed understanding and agreed to proceed.   Review of Systems     Cardiac Risk Factors include: advanced age (>3men, >28 women);hypertension     Objective:    Today's Vitals   01/12/23 1058  Weight: 106 lb (48.1 kg)  Height: 5' 3.5" (1.613 m)   Body mass index is 18.48 kg/m.     01/12/2023   11:10 AM 07/06/2017    8:42 AM 08/25/2016    6:30 AM 04/08/2012    9:07 AM  Advanced Directives  Does Patient Have a Medical Advance Directive? No No No Patient has advance directive, copy not in chart  Type of Advance Directive    Living will  Would patient like information on creating a medical advance directive? Yes (ED - Information included in AVS) Yes (MAU/Ambulatory/Procedural Areas - Information given) No - Patient declined   Pre-existing out of facility DNR order (yellow form or pink MOST form)    No    Current Medications (verified) Outpatient Encounter Medications as of 01/12/2023  Medication Sig   amLODipine (NORVASC) 5 MG tablet Take 1 tablet (5 mg total) by mouth daily.   aspirin EC 81 MG tablet TAKE 1 TABLET EVERY DAY   B Complex Vitamins (B COMPLEX PO) Take 1 tablet by mouth daily.   Cholecalciferol (VITAMIN D-3) 5000 units TABS Take 5,000 Units by mouth daily.   fluticasone (FLONASE) 50 MCG/ACT nasal spray Place 2 sprays into both nostrils daily.   losartan (COZAAR) 50 MG tablet Take 50 mg by mouth daily.   LUTEIN PO multivitamin Take once a day   Omega-3 Fatty Acids (FISH OIL PO) Take 1 capsule by  mouth daily.   rosuvastatin (CRESTOR) 10 MG tablet Take 10 mg by mouth daily.   No facility-administered encounter medications on file as of 01/12/2023.    Allergies (verified) Patient has no known allergies.   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   OSTEOPOROSIS 11/04/2007   S/P 5 years of Fosamax therapy   Palpitations 07/11/2008   S/P radiation > 12 weeks 05/30/2009-07/17/09   completed radiation therapy January 2011   S/P radiation therapy greater than twelve weeks ago 07/04/12-08/18/12   r neck& draining lymph nodes,59.4Gy/318fx   SHOULDER PAIN, RIGHT 05/29/2008   Past Surgical History:  Procedure Laterality Date   CESAREAN SECTION     LEFT HEART CATH AND CORONARY ANGIOGRAPHY N/A 08/25/2016   Procedure: Left Heart Cath and Coronary Angiography;  Surgeon: Rinaldo Cloud, MD;  Location: [facility];  Service: Cardiovascular;  Laterality: N/A;   right partial maxillectomy  02/2009   right selective neck dissection  05/23/2012   SKIN GRAFT  2010   right maxillectomy   Family History  Problem Relation Age of Onset   Heart disease Mother    Hypertension Mother    Cancer Father    Breast cancer Sister    Social History   Socioeconomic History   Marital status: Married    Spouse name: Not on file   Number of children: Not on file   Years of education: Not on file   Highest education level: Not on file  Occupational History   Not on file   Tobacco Use   Smoking status: Never   Smokeless tobacco: Never  Substance and Sexual Activity   Alcohol use: Yes    Comment: very rare glass of wine, occ'l beer   Drug use: No   Sexual activity: Not Currently  Other Topics Concern   Not on file  Social History Narrative   The patient is married and has 3 children.   The patient is a nonsmoker.  Patient has never used smokeless tobacco products.   The patient with rare use of alcohol, drinking wine or beer.   The patient's mother is alive at age is 26 with a history of heart disease, multiple stents, and hypertension.   The father passed away at the age of 32 from Colon Cancer   Social Determinants of Health   Financial Resource Strain: Low Risk  (01/12/2023)   Overall Financial Resource Strain (CARDIA)    Difficulty of Paying Living Expenses: Not very hard  Food Insecurity: No Food Insecurity (01/12/2023)   Hunger Vital Sign    Worried About Running Out of Food in the Last Year: Never true    Ran Out of Food in the Last Year: Never true  Transportation Needs: No Transportation Needs (01/12/2023)   PRAPARE - Administrator, Civil Service (Medical): No    Lack of Transportation (Non-Medical): No  Physical Activity: Sufficiently Active (01/12/2023)   Exercise Vital Sign    Days of Exercise per Week: 3 days    Minutes of Exercise per Session: 60 min  Stress: No Stress Concern Present (01/12/2023)   [employer] of Occupational Health - Occupational Stress Questionnaire    Feeling of Stress : Only a little  Social Connections: Socially Integrated (01/12/2023)   Social Connection and Isolation Panel [NHANES]    Frequency of Communication with Friends and Family: Twice a week    Frequency of Social Gatherings with Friends and Family: Twice a week    Attends Religious Services: More than 4 times per year    Active Member of [employer] or Organizations: Yes    Attends Banker Meetings: 1 to 4 times per year     Marital Status: Married    Tobacco Counseling Counseling given: Not Answered   Clinical Intake:  Pre-visit preparation completed: Yes  Pain : No/denies pain     BMI - recorded: 18.48 Nutritional Status: BMI <19  Underweight Nutritional Risks: Unintentional weight loss Diabetes: No  How often do you need to have someone help you when you read instructions, pamphlets, or other written materials from your doctor or pharmacy?: 2 - Rarely What is the last grade level you completed in school?: 3 years of college  Interpreter Needed?: No  Information entered by ::  , CMA   Activities of Daily Living    01/12/2023   11:00 AM  In your present state of health, do you have any difficulty performing the following activities:  Hearing? 0  Vision? 0  Difficulty concentrating or making decisions? 0  Walking or climbing stairs? 0  Dressing or bathing? 0  Doing errands, shopping? 0  Preparing Food and eating ? N  Using the Toilet? N  In the past six months, have you accidently leaked urine? N  Do you have problems with loss of bowel control? N  Managing your Medications? N  Managing your Finances? N  Housekeeping or managing your Housekeeping? N    Patient Care Team: Myrlene Broker, MD as PCP - General (Internal Medicine) Lonie Peak, MD as Attending Physician (Radiation Oncology) Barrie Folk, RN (Inactive) as Oncology Nurse Navigator (Oncology)  Indicate any recent Medical Services you may have received from other than [facility] providers in the past year (date may be approximate).     Assessment:   This is a routine wellness examination for [place].  Hearing/Vision screen Hearing Screening - Comments:: No concerns  Dietary issues and exercise activities discussed:     Goals Addressed             This Visit's Progress    DIET - EAT MORE FRUITS AND VEGETABLES        Depression Screen    01/12/2023   11:14 AM 10/13/2022    9:57 AM  07/17/2022   10:19 AM 02/11/2022   10:40 AM 12/09/2020   10:08 AM 01/16/2020   10:56 AM 10/17/2019   10:48 AM  PHQ 2/9 Scores  PHQ - 2 Score 2 0 0 0 0 0 0  PHQ- 9 Score 8 2 0   0 0    Fall Risk    01/12/2023   11:00 AM 10/13/2022    9:57 AM 07/17/2022   10:19 AM 02/11/2022   10:39 AM 12/09/2020   10:08 AM  Fall Risk   Falls in the past year? 0 0 0 1 1  Number falls in past yr:  0 0 0 0  Injury with Fall?  0 0 0 0  Risk for fall due to : No Fall Risks   No Fall Risks No Fall Risks  Follow up  Falls evaluation completed Falls evaluation completed Falls evaluation completed Falls evaluation completed    MEDICARE RISK AT HOME:  Medicare Risk at Home - 01/12/23 1111     Any stairs in or around the home? No    Home free of loose throw rugs in walkways, pet beds, electrical cords, etc? Yes    Adequate lighting in your home to reduce risk of falls? Yes    Life alert? No    Use of a cane, walker or w/c? No    Grab bars in the bathroom? No    Shower chair or bench in shower? No    Elevated toilet seat or a handicapped toilet? No             TIMED UP AND GO:  Was the test performed?  No    Cognitive Function:        01/12/2023   11:12 AM  6CIT Screen  What Year? 0 points  What month? 0 points  What time? 0 points  Count back from 20 0 points  Months in reverse 0 points  Repeat phrase 0 points  Total Score 0 points    Immunizations Immunization History  Administered Date(s) Administered   Tdap 04/24/2013    TDAP status: Up  to date  Flu Vaccine status: Declined, Education has been provided regarding the importance of this vaccine but patient still declined. Advised may receive this vaccine at local pharmacy or Health Dept. Aware to provide a copy of the vaccination record if obtained from local pharmacy or Health Dept. Verbalized acceptance and understanding.  Pneumococcal vaccine status: Declined,  Education has been provided regarding the importance of this vaccine  but patient still declined. Advised may receive this vaccine at local pharmacy or Health Dept. Aware to provide a copy of the vaccination record if obtained from local pharmacy or Health Dept. Verbalized acceptance and understanding.   Covid-19 vaccine status: Declined, Education has been provided regarding the importance of this vaccine but patient still declined. Advised may receive this vaccine at local pharmacy or Health Dept.or vaccine clinic. Aware to provide a copy of the vaccination record if obtained from local pharmacy or Health Dept. Verbalized acceptance and understanding.  Qualifies for Shingles Vaccine? Yes   Zostavax completed No   Shingrix Completed?: No.    Education has been provided regarding the importance of this vaccine. Patient has been advised to call insurance company to determine out of pocket expense if they have not yet received this vaccine. Advised may also receive vaccine at local pharmacy or Health Dept. Verbalized acceptance and understanding.  Screening Tests Health Maintenance  Topic Date Due   COVID-19 Vaccine (1) Never done   Pneumonia Vaccine 68+ Years old (1 of 2 - PCV) Never done   Hepatitis C Screening  Never done   Zoster Vaccines- Shingrix (1 of 2) Never done   Colonoscopy  07/18/2023 (Originally 06/29/2022)   INFLUENZA VACCINE  01/28/2023   DTaP/Tdap/Td (2 - Td or Tdap) 04/25/2023   Medicare Annual Wellness (AWV)  01/12/2024   MAMMOGRAM  09/08/2024   DEXA SCAN  Completed   HPV VACCINES  Aged Out    Health Maintenance  Health Maintenance Due  Topic Date Due   COVID-19 Vaccine (1) Never done   Pneumonia Vaccine 73+ Years old (1 of 2 - PCV) Never done   Hepatitis C Screening  Never done   Zoster Vaccines- Shingrix (1 of 2) Never done    Colorectal cancer screening: Type of screening: Colonoscopy. Completed 06/29/2012. Repeat every 10 years  Mammogram status: Completed 09/09/2022. Repeat every year  Bone Density status: Completed 12/12/2014.  Results reflect: Bone density results: OSTEOPOROSIS. Repeat every 2 years.  Lung Cancer Screening: (Low Dose CT Chest recommended if Age 70-80 years, 20 pack-year currently smoking OR have quit w/in 15years.) does not qualify.   Additional Screening:  Hepatitis C Screening: unknown  Vision Screening: Recommended annual ophthalmology exams for early detection of glaucoma and other disorders of the eye. Is the patient up to date with their annual eye exam?  Yes  Who is the provider or what is the name of the office in which the patient attends annual eye exams? Dr. Dione Booze If pt is not established with a provider, would they like to be referred to a provider to establish care? No .   Dental Screening: Recommended annual dental exams for proper oral hygiene  Community Resource Referral / Chronic Care Management: CRR required this visit?  No   CCM required this visit?  No     Plan:     I have personally reviewed and noted the following in the patient's chart:   Medical and social history Use of alcohol, tobacco or illicit drugs  Current medications and supplements including opioid  prescriptions. Patient is not currently taking opioid prescriptions. Functional ability and status Nutritional status Physical activity Advanced directives List of other physicians Hospitalizations, surgeries, and ER visits in previous 12 months Vitals Screenings to include cognitive, depression, and falls Referrals and appointments  In addition, I have reviewed and discussed with patient certain preventive protocols, quality metrics, and best practice recommendations. A written personalized care plan for preventive services as well as general preventive health recommendations were provided to patient.       Arna Medici, CMA   01/12/2023   After Visit Summary: (MyChart) Due to this being a telephonic visit, the after visit summary with patients personalized plan was offered to patient via MyChart    Nurse Notes: PATIENT WOULD LIKE TO F/U ON HER A1C AND CHOLESTEROL LEVEL. HAS BEEN TAKING SUPPLEMENTS TO HELP LOWER HER NUMBERS.

## 2023-01-12 NOTE — Patient Instructions (Signed)
Brandy Ramos , Thank you for taking time to come for your Medicare Wellness Visit. I appreciate your ongoing commitment to your health goals. Please review the following plan we discussed and let me know if I can assist you in the future.   These are the goals we discussed:  Goals      DIET - EAT MORE FRUITS AND VEGETABLES        This is a list of the screening recommended for you and due dates:  Health Maintenance  Topic Date Due   COVID-19 Vaccine (1) Never done   Pneumonia Vaccine (1 of 2 - PCV) Never done   Hepatitis C Screening  Never done   Zoster (Shingles) Vaccine (1 of 2) Never done   Colon Cancer Screening  07/18/2023*   Flu Shot  01/28/2023   DTaP/Tdap/Td vaccine (2 - Td or Tdap) 04/25/2023   Medicare Annual Wellness Visit  01/12/2024   Mammogram  09/08/2024   DEXA scan (bone density measurement)  Completed   HPV Vaccine  Aged Out  *Topic was postponed. The date shown is not the original due date.     Health Maintenance After Age 45 After age 11, you are at a higher risk for certain long-term diseases and infections as well as injuries from falls. Falls are a major cause of broken bones and head injuries in people who are older than age 72. Getting regular preventive care can help to keep you healthy and well. Preventive care includes getting regular testing and making lifestyle changes as recommended by your health care provider. Talk with your health care provider about: Which screenings and tests you should have. A screening is a test that checks for a disease when you have no symptoms. A diet and exercise plan that is right for you. What should I know about screenings and tests to prevent falls? Screening and testing are the best ways to find a health problem early. Early diagnosis and treatment give you the best chance of managing medical conditions that are common after age 43. Certain conditions and lifestyle choices may make you more likely to have a fall. Your  health care provider may recommend: Regular vision checks. Poor vision and conditions such as cataracts can make you more likely to have a fall. If you wear glasses, make sure to get your prescription updated if your vision changes. Medicine review. Work with your health care provider to regularly review all of the medicines you are taking, including over-the-counter medicines. Ask your health care provider about any side effects that may make you more likely to have a fall. Tell your health care provider if any medicines that you take make you feel dizzy or sleepy. Strength and balance checks. Your health care provider may recommend certain tests to check your strength and balance while standing, walking, or changing positions. Foot health exam. Foot pain and numbness, as well as not wearing proper footwear, can make you more likely to have a fall. Screenings, including: Osteoporosis screening. Osteoporosis is a condition that causes the bones to get weaker and break more easily. Blood pressure screening. Blood pressure changes and medicines to control blood pressure can make you feel dizzy. Depression screening. You may be more likely to have a fall if you have a fear of falling, feel depressed, or feel unable to do activities that you used to do. Alcohol use screening. Using too much alcohol can affect your balance and may make you more likely to have a fall.  Follow these instructions at home: Lifestyle Do not drink alcohol if: Your health care provider tells you not to drink. If you drink alcohol: Limit how much you have to: 0-1 drink a day for women. 0-2 drinks a day for men. Know how much alcohol is in your drink. In the [place] one drink equals one 12 oz bottle of beer (355 mL), one 5 oz glass of wine (148 mL), or one 1 oz glass of hard liquor (44 mL). Do not use any products that contain nicotine or tobacco. These products include cigarettes, chewing tobacco, and vaping devices, such as  e-cigarettes. If you need help quitting, ask your health care provider. Activity  Follow a regular exercise program to stay fit. This will help you maintain your balance. Ask your health care provider what types of exercise are appropriate for you. If you need a cane or walker, use it as recommended by your health care provider. Wear supportive shoes that have nonskid soles. Safety  Remove any tripping hazards, such as rugs, cords, and clutter. Install safety equipment such as grab bars in bathrooms and safety rails on stairs. Keep rooms and walkways well-lit. General instructions Talk with your health care provider about your risks for falling. Tell your health care provider if: You fall. Be sure to tell your health care provider about all falls, even ones that seem minor. You feel dizzy, tiredness (fatigue), or off-balance. Take over-the-counter and prescription medicines only as told by your health care provider. These include supplements. Eat a healthy diet and maintain a healthy weight. A healthy diet includes low-fat dairy products, low-fat (lean) meats, and fiber from whole grains, beans, and lots of fruits and vegetables. Stay current with your vaccines. Schedule regular health, dental, and eye exams. Summary Having a healthy lifestyle and getting preventive care can help to protect your health and wellness after age 46. Screening and testing are the best way to find a health problem early and help you avoid having a fall. Early diagnosis and treatment give you the best chance for managing medical conditions that are more common for people who are older than age 63. Falls are a major cause of broken bones and head injuries in people who are older than age 53. Take precautions to prevent a fall at home. Work with your health care provider to learn what changes you can make to improve your health and wellness and to prevent falls. This information is not intended to replace advice given  to you by your health care provider. Make sure you discuss any questions you have with your health care provider. Document Revised: 11/04/2020 Document Reviewed: 11/04/2020 Elsevier Patient Education  2024 Elsevier Inc.  Per patient no change in vitals since last visit, unable to obtain new vitals due to telehealth visit

## 2023-02-02 NOTE — Progress Notes (Addendum)
Brandy Ramos was not in office on this day. Please co-sign. Thank you!  Medical screening examination/treatment/procedure(s) were performed by non-physician practitioner and as supervising physician I was immediately available for consultation/collaboration.  I agree with above. Jacinta Shoe, MD

## 2023-02-15 ENCOUNTER — Ambulatory Visit: Payer: Medicare Other | Admitting: Internal Medicine

## 2023-02-15 ENCOUNTER — Encounter: Payer: Self-pay | Admitting: Internal Medicine

## 2023-02-15 VITALS — BP 140/90 | HR 64 | Temp 98.2°F | Ht 63.5 in | Wt 111.0 lb

## 2023-02-15 DIAGNOSIS — I10 Essential (primary) hypertension: Secondary | ICD-10-CM

## 2023-02-15 DIAGNOSIS — Z0000 Encounter for general adult medical examination without abnormal findings: Secondary | ICD-10-CM | POA: Diagnosis not present

## 2023-02-15 DIAGNOSIS — J301 Allergic rhinitis due to pollen: Secondary | ICD-10-CM

## 2023-02-15 DIAGNOSIS — C770 Secondary and unspecified malignant neoplasm of lymph nodes of head, face and neck: Secondary | ICD-10-CM

## 2023-02-15 DIAGNOSIS — R7301 Impaired fasting glucose: Secondary | ICD-10-CM

## 2023-02-15 DIAGNOSIS — E559 Vitamin D deficiency, unspecified: Secondary | ICD-10-CM | POA: Diagnosis not present

## 2023-02-15 DIAGNOSIS — R636 Underweight: Secondary | ICD-10-CM | POA: Diagnosis not present

## 2023-02-15 DIAGNOSIS — E7800 Pure hypercholesterolemia, unspecified: Secondary | ICD-10-CM | POA: Diagnosis not present

## 2023-02-15 LAB — COMPREHENSIVE METABOLIC PANEL
ALT: 10 U/L (ref 0–35)
AST: 16 U/L (ref 0–37)
Albumin: 4.1 g/dL (ref 3.5–5.2)
Alkaline Phosphatase: 64 U/L (ref 39–117)
BUN: 14 mg/dL (ref 6–23)
CO2: 27 mEq/L (ref 19–32)
Calcium: 9.6 mg/dL (ref 8.4–10.5)
Chloride: 106 mEq/L (ref 96–112)
Creatinine, Ser: 0.93 mg/dL (ref 0.40–1.20)
GFR: 61.11 mL/min (ref >60.00)
Glucose, Bld: 89 mg/dL (ref 70–99)
Potassium: 3.8 mEq/L (ref 3.5–5.1)
Sodium: 142 mEq/L (ref 135–145)
Total Bilirubin: 0.5 mg/dL (ref 0.2–1.2)
Total Protein: 7.2 g/dL (ref 6.0–8.3)

## 2023-02-15 LAB — LIPID PANEL
Cholesterol: 267 mg/dL — ABNORMAL HIGH (ref 0–200)
HDL: 78 mg/dL (ref >39.00)
LDL Cholesterol: 179 mg/dL — ABNORMAL HIGH (ref 0–99)
NonHDL: 189.21
Total CHOL/HDL Ratio: 3
Triglycerides: 52 mg/dL (ref 0.0–149.0)
VLDL: 10.4 mg/dL (ref 0.0–40.0)

## 2023-02-15 LAB — HEMOGLOBIN A1C: Hgb A1c MFr Bld: 5.9 % (ref 4.6–6.5)

## 2023-02-15 LAB — VITAMIN D 25 HYDROXY (VIT D DEFICIENCY, FRACTURES): VITD: 41.93 ng/mL (ref 30.00–100.00)

## 2023-02-15 MED ORDER — AMLODIPINE BESYLATE 5 MG PO TABS: 5.0000 mg | ORAL_TABLET | Freq: Every day | ORAL | 3 refills | Status: AC

## 2023-02-15 MED ORDER — FLUTICASONE PROPIONATE 50 MCG/ACT NA SUSP: 2.0000 | Freq: Every day | NASAL | 6 refills | Status: DC

## 2023-02-15 NOTE — Patient Instructions (Signed)
We will check the blood work today. ° ° °

## 2023-02-15 NOTE — Progress Notes (Unsigned)
   Subjective:   Patient ID: Brandy Ramos, female    DOB: 09-10-1950, 72 y.o.   MRN: [id number]  HPI The patient is here for physical.  PMH, [facility], social history reviewed and updated  Review of Systems  Objective:  Physical Exam  Vitals:   02/15/23 1106 02/15/23 1108  BP: (!) 140/90 (!) 140/90  Pulse: 64   Temp: 98.2 F (36.8 C)   TempSrc: Oral   SpO2: 96%   Weight: 111 lb (50.3 kg)   Height: 5' 3.5" (1.613 m)     Assessment & Plan:

## 2023-02-17 ENCOUNTER — Encounter: Payer: Self-pay | Admitting: Internal Medicine

## 2023-02-17 NOTE — Assessment & Plan Note (Signed)
Flu shot yearly. Pneumonia declines. Shingrix declines. Tetanus due at pharmacy. Colonoscopy up to date. Mammogram up to date, pap smear aged out and dexa up to date. Counseled about sun safety and mole surveillance. Counseled about the dangers of distracted driving. Given 10 year screening recommendations.

## 2023-02-17 NOTE — Assessment & Plan Note (Signed)
BP borderline today but improved from prior on amlodipine 5 mg daily and losartan 50 mg daily. Checking CMP and adjust as needed.

## 2023-02-17 NOTE — Assessment & Plan Note (Signed)
Checking lipid panel and adjust crestor 10 mg daily as needed. Goal LDL <100 ideal.

## 2023-02-17 NOTE — Assessment & Plan Note (Signed)
Checking vitamin D level and adjust as needed.  

## 2023-02-17 NOTE — Assessment & Plan Note (Signed)
Checking HgA1c and adjust as needed.  

## 2023-02-17 NOTE — Assessment & Plan Note (Signed)
She has increased food and still low weight. She is working out some and this has helped her gain muscle in the past.

## 2023-02-17 NOTE — Assessment & Plan Note (Signed)
Rx flonase

## 2023-02-17 NOTE — Assessment & Plan Note (Signed)
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

## 2023-03-16 ENCOUNTER — Encounter: Payer: Self-pay | Admitting: Internal Medicine

## 2023-04-28 DIAGNOSIS — H10413 Chronic giant papillary conjunctivitis, bilateral: Secondary | ICD-10-CM | POA: Diagnosis not present

## 2023-04-28 DIAGNOSIS — H40013 Open angle with borderline findings, low risk, bilateral: Secondary | ICD-10-CM | POA: Diagnosis not present

## 2023-04-28 DIAGNOSIS — H2513 Age-related nuclear cataract, bilateral: Secondary | ICD-10-CM | POA: Diagnosis not present

## 2023-08-18 ENCOUNTER — Ambulatory Visit: Payer: Medicare Other | Admitting: Internal Medicine

## 2023-08-31 ENCOUNTER — Ambulatory Visit: Payer: Medicare Other | Admitting: Internal Medicine

## 2023-09-17 ENCOUNTER — Ambulatory Visit: Admitting: Internal Medicine

## 2023-09-20 DIAGNOSIS — Z1231 Encounter for screening mammogram for malignant neoplasm of breast: Secondary | ICD-10-CM | POA: Diagnosis not present

## 2023-09-22 ENCOUNTER — Other Ambulatory Visit: Payer: Self-pay | Admitting: Obstetrics and Gynecology

## 2023-09-22 DIAGNOSIS — R928 Other abnormal and inconclusive findings on diagnostic imaging of breast: Secondary | ICD-10-CM

## 2023-10-05 ENCOUNTER — Ambulatory Visit

## 2023-10-05 ENCOUNTER — Ambulatory Visit
Admission: RE | Admit: 2023-10-05 | Discharge: 2023-10-05 | Disposition: A | Discharge status: Home / Self Care | Source: Ambulatory Visit | Attending: Obstetrics and Gynecology | Admitting: Obstetrics and Gynecology

## 2023-10-05 DIAGNOSIS — R928 Other abnormal and inconclusive findings on diagnostic imaging of breast: Secondary | ICD-10-CM

## 2023-10-07 ENCOUNTER — Encounter: Payer: Self-pay | Admitting: Internal Medicine

## 2023-10-07 ENCOUNTER — Ambulatory Visit (INDEPENDENT_AMBULATORY_CARE_PROVIDER_SITE_OTHER): Admitting: Internal Medicine

## 2023-10-07 VITALS — BP 130/80 | HR 77 | Temp 97.4°F | Ht 63.5 in | Wt 115.0 lb

## 2023-10-07 DIAGNOSIS — E559 Vitamin D deficiency, unspecified: Secondary | ICD-10-CM

## 2023-10-07 DIAGNOSIS — E7800 Pure hypercholesterolemia, unspecified: Secondary | ICD-10-CM

## 2023-10-07 DIAGNOSIS — I10 Essential (primary) hypertension: Secondary | ICD-10-CM

## 2023-10-07 DIAGNOSIS — R7301 Impaired fasting glucose: Secondary | ICD-10-CM

## 2023-10-07 LAB — CBC
HCT: 41.7 % (ref 36.0–46.0)
Hemoglobin: 13.3 g/dL (ref 12.0–15.0)
MCHC: 31.9 g/dL (ref 30.0–36.0)
MCV: 70.6 fl — ABNORMAL LOW (ref 78.0–100.0)
Platelets: 232 10*3/uL (ref 150.0–400.0)
RBC: 5.91 Mil/uL — ABNORMAL HIGH (ref 3.87–5.11)
RDW: 16 % — ABNORMAL HIGH (ref 11.5–15.5)
WBC: 4.5 10*3/uL (ref 4.0–10.5)

## 2023-10-07 LAB — HEMOGLOBIN A1C: Hgb A1c MFr Bld: 5.9 % (ref 4.6–6.5)

## 2023-10-07 LAB — VITAMIN D 25 HYDROXY (VIT D DEFICIENCY, FRACTURES): VITD: 39.62 ng/mL (ref 30.00–100.00)

## 2023-10-07 LAB — COMPREHENSIVE METABOLIC PANEL WITH GFR
ALT: 9 U/L (ref 0–35)
AST: 17 U/L (ref 0–37)
Albumin: 4.3 g/dL (ref 3.5–5.2)
Alkaline Phosphatase: 63 U/L (ref 39–117)
BUN: 17 mg/dL (ref 6–23)
CO2: 30 meq/L (ref 19–32)
Calcium: 9.2 mg/dL (ref 8.4–10.5)
Chloride: 104 meq/L (ref 96–112)
Creatinine, Ser: 0.99 mg/dL (ref 0.40–1.20)
GFR: 56.44 mL/min — ABNORMAL LOW (ref >60.00)
Glucose, Bld: 94 mg/dL (ref 70–99)
Potassium: 4.1 meq/L (ref 3.5–5.1)
Sodium: 140 meq/L (ref 135–145)
Total Bilirubin: 0.4 mg/dL (ref 0.2–1.2)
Total Protein: 7.1 g/dL (ref 6.0–8.3)

## 2023-10-07 LAB — LIPID PANEL
Cholesterol: 245 mg/dL — ABNORMAL HIGH (ref 0–200)
HDL: 82.6 mg/dL (ref >39.00)
LDL Cholesterol: 151 mg/dL — ABNORMAL HIGH (ref 0–99)
NonHDL: 162.77
Total CHOL/HDL Ratio: 3
Triglycerides: 59 mg/dL (ref 0.0–149.0)
VLDL: 11.8 mg/dL (ref 0.0–40.0)

## 2023-10-07 LAB — VITAMIN B12: Vitamin B-12: 284 pg/mL (ref 211–911)

## 2023-10-07 LAB — TSH: TSH: 2.27 u[IU]/mL (ref 0.35–5.50)

## 2023-10-07 NOTE — Assessment & Plan Note (Signed)
 Checking CMP and adjust losartan 50 mg daily and amlodipine 5 mg daily as needed. BP at goal today.

## 2023-10-07 NOTE — Assessment & Plan Note (Signed)
 Checking lipid panel and adjust crestor as needed.

## 2023-10-07 NOTE — Assessment & Plan Note (Signed)
 Checking HGA1c and adjust as needed.

## 2023-10-07 NOTE — Patient Instructions (Signed)
 We will check the labs today.

## 2023-10-07 NOTE — Progress Notes (Signed)
   Subjective:   Patient ID: Brandy Ramos, female    DOB: [birth year], 73 y.o.   MRN: [id number]  HPI The patient is a 73 YO female coming in for medical management (see A/P for details)  Review of Systems  Constitutional: Negative.   HENT: Negative.    Eyes: Negative.   Respiratory:  Negative for cough, chest tightness and shortness of breath.   Cardiovascular:  Negative for chest pain, palpitations and leg swelling.  Gastrointestinal:  Negative for abdominal distention, abdominal pain, constipation, diarrhea, nausea and vomiting.  Musculoskeletal: Negative.   Skin: Negative.   Neurological: Negative.   Psychiatric/Behavioral: Negative.      Objective:  Physical Exam Constitutional:      Appearance: She is well-developed.  HENT:     Head: Normocephalic and atraumatic.  Cardiovascular:     Rate and Rhythm: Normal rate and regular rhythm.  Pulmonary:     Effort: Pulmonary effort is normal. No respiratory distress.     Breath sounds: Normal breath sounds. No wheezing or rales.  Abdominal:     General: Bowel sounds are normal. There is no distension.     Palpations: Abdomen is soft.     Tenderness: There is no abdominal tenderness. There is no rebound.  Musculoskeletal:     Cervical back: Normal range of motion.  Skin:    General: Skin is warm and dry.  Neurological:     Mental Status: She is alert and oriented to person, place, and time.     Coordination: Coordination normal.     Vitals:   10/07/23 0956  BP: 130/80  Pulse: 77  Temp: (!) 97.4 F (36.3 C)  TempSrc: Oral  SpO2: 94%  Weight: 115 lb (52.2 kg)  Height: 5' 3.5" (1.613 m)    Assessment & Plan:

## 2023-10-07 NOTE — Assessment & Plan Note (Signed)
 Checking vitamin D and adjust as needed. Some change in fatigue.

## 2023-10-14 ENCOUNTER — Telehealth: Payer: Self-pay | Admitting: Internal Medicine

## 2023-10-14 NOTE — Telephone Encounter (Signed)
 Copied from CRM [id number]. Topic: Clinical - Lab/Test Results >> Oct 14, 2023 12:42 PM Luane Rumps D wrote: Reason for CRM: Patient called for lab results and stated that she will follow Dr. Constance Dellen guidance regarding the elevated cholesterol, she did state that she thinks she may have a cholesterol medication already but would like to verify.

## 2023-10-18 NOTE — Progress Notes (Signed)
 Patient was able to review results but she is already taking a cholesterol medication.

## 2023-10-18 NOTE — Telephone Encounter (Signed)
 I have looked over patients med list and pt is already taking Crestor  for her cholesterol please advise

## 2023-10-20 NOTE — Telephone Encounter (Signed)
 Patient states that she is not taking this medication and does agree on starting it

## 2023-10-20 NOTE — Telephone Encounter (Signed)
 We have not prescribed verify if she is taking crestor  10 mg daily. If so we will change dose. If not we should start this.

## 2023-10-21 MED ORDER — ROSUVASTATIN CALCIUM 10 MG PO TABS: 10.0000 mg | ORAL_TABLET | Freq: Every day | ORAL | 3 refills | Status: AC

## 2023-10-21 NOTE — Telephone Encounter (Signed)
 Sent in

## 2024-01-13 ENCOUNTER — Ambulatory Visit: Payer: Medicare Other

## 2024-01-13 VITALS — Ht 63.5 in | Wt 115.0 lb

## 2024-01-13 DIAGNOSIS — Z1212 Encounter for screening for malignant neoplasm of rectum: Secondary | ICD-10-CM

## 2024-01-13 DIAGNOSIS — Z0000 Encounter for general adult medical examination without abnormal findings: Secondary | ICD-10-CM | POA: Diagnosis not present

## 2024-01-13 DIAGNOSIS — Z1211 Encounter for screening for malignant neoplasm of colon: Secondary | ICD-10-CM

## 2024-01-13 NOTE — Progress Notes (Signed)
 Subjective:   Brandy Ramos is a 73 y.o. who presents for a Medicare Wellness preventive visit.  As a reminder, Annual Wellness Visits don't include a physical exam, and some assessments may be limited, especially if this visit is performed virtually. We may recommend an in-person follow-up visit with your provider if needed.  Visit Complete: Virtual I connected with  Brandy Ramos on 01/13/24 by a audio enabled telemedicine application and verified that I am speaking with the correct person using two identifiers.  Patient Location: Home  Provider Location: Office/Clinic  I discussed the limitations of evaluation and management by telemedicine. The patient expressed understanding and agreed to proceed.  Vital Signs: Because this visit was a virtual/telehealth visit, some criteria may be missing or patient reported. Any vitals not documented were not able to be obtained and vitals that have been documented are patient reported.  VideoDeclined- This patient declined Librarian, academic. Therefore the visit was completed with audio only.  Persons Participating in Visit: Patient.  AWV Questionnaire: No: Patient Medicare AWV questionnaire was not completed prior to this visit.  Cardiac Risk Factors include: advanced age (>21men, >26 women);hypertension     Objective:    Today's Vitals   01/13/24 1133  Weight: 115 lb (52.2 kg)  Height: 5' 3.5 (1.613 m)   Body mass index is 20.05 kg/m.     01/12/2023   11:10 AM 07/06/2017    8:42 AM 08/25/2016    6:30 AM 04/08/2012    9:07 AM  Advanced Directives  Does Patient Have a Medical Advance Directive? No No  No  Patient has advance directive, copy not in chart   Type of Advance Directive    Living will   Would patient like information on creating a medical advance directive? Yes (ED - Information included in AVS) Yes (MAU/Ambulatory/Procedural Areas - Information given)  No - Patient declined     Pre-existing out of facility DNR order (yellow form or pink MOST form)    No      Data saved with a previous flowsheet row definition    Current Medications (verified) Outpatient Encounter Medications as of 01/13/2024  Medication Sig   amLODipine  (NORVASC ) 5 MG tablet Take 1 tablet (5 mg total) by mouth daily.   aspirin  EC 81 MG tablet TAKE 1 TABLET EVERY DAY   B Complex Vitamins (B COMPLEX PO) Take 1 tablet by mouth daily.   Cholecalciferol (VITAMIN D -3) 5000 units TABS Take 5,000 Units by mouth daily.   fluticasone  (FLONASE ) 50 MCG/ACT nasal spray Place 2 sprays into both nostrils daily.   losartan (COZAAR) 50 MG tablet Take 50 mg by mouth daily.   LUTEIN PO multivitamin Take once a day   Omega-3 Fatty Acids (FISH OIL PO) Take 1 capsule by mouth daily.   rosuvastatin  (CRESTOR ) 10 MG tablet Take 1 tablet (10 mg total) by mouth daily. (Patient not taking: Reported on 01/13/2024)   No facility-administered encounter medications on file as of 01/13/2024.    Allergies (verified) Patient has no known allergies.   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   OSTEOPOROSIS 11/04/2007   S/P 5 years of Fosamax therapy   Palpitations 07/11/2008   S/P radiation > 12 weeks 05/30/2009-07/17/09   completed radiation therapy January 2011   S/P radiation therapy greater than twelve weeks ago 07/04/12-08/18/12   r neck& draining lymph nodes,59.4Gy/345fx   SHOULDER PAIN,  RIGHT 05/29/2008   Past Surgical History:  Procedure Laterality Date   BREAST BIOPSY Right 2018   CESAREAN SECTION     LEFT HEART CATH AND CORONARY ANGIOGRAPHY N/A 08/25/2016   Procedure: Left Heart Cath and Coronary Angiography;  Surgeon: Rober Chroman, MD;  Location: [facility];  Service: Cardiovascular;  Laterality: N/A;   right partial maxillectomy  02/27/2009   right selective neck dissection  05/23/2012   SKIN GRAFT  06/29/2008   right maxillectomy   Family History  Problem Relation Age of Onset   Heart disease Mother    Hypertension Mother    Cancer Father    Breast cancer Sister    Social History   Socioeconomic History   Marital status: Married    Spouse name: Sherida   Number of children: 3   Years of education: Not on file   Highest education level: Not on file  Occupational History   Occupation: RETIRED  Tobacco Use   Smoking status: Never   Smokeless tobacco: Never  Vaping Use   Vaping status: Never Used  Substance and Sexual Activity   Alcohol use: Yes    Comment: very rare glass of wine, occ'l beer   Drug use: No   Sexual activity: Not Currently  Other Topics Concern   Not on file  Social History Narrative   ** Merged History Encounter ** The patient is married and has 3 children.The patient is a nonsmoker.  Patient has never used smokeless tobacco products.The patient with rare use of alcohol, drinking wine or beer.The patient's mother is alive at age is 78 with a history of heart disease, multiple stents, and hypertension.The father passed away at the age of 61 from Colon Cancer      Lives with husband/2025   Social Drivers of Health   Financial Resource Strain: Low Risk  (01/12/2023)   Overall Financial Resource Strain (CARDIA)    Difficulty of Paying Living Expenses: Not very hard  Food Insecurity: No Food Insecurity (01/12/2023)   Hunger Vital Sign    Worried About Running Out of Food in the Last Year: Never true    Ran Out of Food in the  Last Year: Never true  Transportation Needs: No Transportation Needs (01/13/2024)   PRAPARE - Administrator, Civil Service (Medical): No    Lack of Transportation (Non-Medical): No  Physical Activity: Sufficiently Active (01/13/2024)   Exercise Vital Sign    Days of Exercise per Week: 3 days    Minutes of Exercise per Session: 60 min  Stress: No Stress Concern Present (01/13/2024)   [employer] of Occupational Health - Occupational Stress Questionnaire    Feeling of Stress: Only a little  Social Connections: Socially Integrated (01/13/2024)   Social Connection and Isolation Panel    Frequency of Communication with Friends and Family: More than three times a week    Frequency of Social Gatherings with Friends and Family: Twice a week    Attends Religious Services: More than 4 times per  year    Active Member of Clubs or Organizations: Yes    Attends Banker Meetings: Never    Marital Status: Married    Tobacco Counseling Counseling given: Not Answered    Clinical Intake:  Pre-visit preparation completed: Yes  Pain : No/denies pain     BMI - recorded: 20.05 Nutritional Status: BMI of 19-24  Normal Nutritional Risks: None Diabetes: No  Lab Results  Component Value Date   HGBA1C 5.9 10/07/2023   HGBA1C 5.9 02/15/2023   HGBA1C 6.0 07/17/2022     How often do you need to have someone help you when you read instructions, pamphlets, or other written materials from your doctor or pharmacy?: 1 - Never  Interpreter Needed?: No  Information entered by :: Beadie Matsunaga, RMA   Activities of Daily Living     01/13/2024   11:36 AM  In your present state of health, do you have any difficulty performing the following activities:  Hearing? 0  Vision? 0  Difficulty concentrating or making decisions? 0  Walking or climbing stairs? 0  Dressing or bathing? 0  Doing errands, shopping? 0  Preparing Food and eating ? N  Using the Toilet? N  In the  past six months, have you accidently leaked urine? Y  Do you have problems with loss of bowel control? N  Managing your Medications? N  Managing your Finances? N  Housekeeping or managing your Housekeeping? N    Patient Care Team: Rollene Almarie LABOR, MD as PCP - General (Internal Medicine) Izell Domino, MD as Attending Physician (Radiation Oncology) Alray Charlie LABOR, RN (Inactive) as Oncology Nurse Navigator (Oncology)  I have updated your Care Teams any recent Medical Services you may have received from other providers in the past year.     Assessment:   This is a routine wellness examination for Brandy Ramos.  Hearing/Vision screen Hearing Screening - Comments:: Denies hearing difficulties   Vision Screening - Comments:: Wears eyeglasses/Dr. Octavia   Goals Addressed   None    Depression Screen     01/13/2024   11:43 AM 01/12/2023   11:14 AM 10/13/2022    9:57 AM 07/17/2022   10:19 AM 02/11/2022   10:40 AM 12/09/2020   10:08 AM 01/16/2020   10:56 AM  PHQ 2/9 Scores  PHQ - 2 Score 1 2 0 0 0 0 0  PHQ- 9 Score 1 8 2  0   0    Fall Risk     01/13/2024   11:40 AM 10/07/2023   10:00 AM 02/15/2023   11:09 AM 01/12/2023   11:00 AM 10/13/2022    9:57 AM  Fall Risk   Falls in the past year? 0 1 0 0 0  Number falls in past yr: 0 0 0  0  Injury with Fall? 0 0 0  0  Risk for fall due to :    No Fall Risks   Follow up Falls evaluation completed;Falls prevention discussed Falls evaluation completed Falls evaluation completed  Falls evaluation completed    MEDICARE RISK AT HOME:  Medicare Risk at Home Any stairs in or around the home?: Yes If so, are there any without handrails?: Yes Home free of loose throw rugs in walkways, pet beds, electrical cords, etc?: Yes Adequate lighting in your home to reduce risk of falls?: Yes Life alert?: No Use of a cane, walker or w/c?: No Grab bars in the bathroom?: No Shower chair or bench in shower?: No Elevated toilet seat or  a handicapped  toilet?: No  TIMED UP AND GO:  Was the test performed?  No  Cognitive Function: Declined/Normal: No cognitive concerns noted by patient or family. Patient alert, oriented, able to answer questions appropriately and recall recent events. No signs of memory loss or confusion.        01/12/2023   11:12 AM  6CIT Screen  What Year? 0 points  What month? 0 points  What time? 0 points  Count back from 20 0 points  Months in reverse 0 points  Repeat phrase 0 points  Total Score 0 points    Immunizations Immunization History  Administered Date(s) Administered   Tdap 04/24/2013    Screening Tests Health Maintenance  Topic Date Due   COVID-19 Vaccine (1) Never done   Hepatitis C Screening  Never done   Pneumococcal Vaccine: 50+ Years (1 of 2 - PCV) Never done   Zoster Vaccines- Shingrix (1 of 2) Never done   Colonoscopy  06/29/2022   DTaP/Tdap/Td (2 - Td or Tdap) 04/25/2023   Medicare Annual Wellness (AWV)  01/12/2024   INFLUENZA VACCINE  01/28/2024   MAMMOGRAM  09/08/2024   DEXA SCAN  Completed   Hepatitis B Vaccines  Aged Out   HPV VACCINES  Aged Out   Meningococcal B Vaccine  Aged Out    Health Maintenance  Health Maintenance Due  Topic Date Due   COVID-19 Vaccine (1) Never done   Hepatitis C Screening  Never done   Pneumococcal Vaccine: 50+ Years (1 of 2 - PCV) Never done   Zoster Vaccines- Shingrix (1 of 2) Never done   Colonoscopy  06/29/2022   DTaP/Tdap/Td (2 - Td or Tdap) 04/25/2023   Medicare Annual Wellness (AWV)  01/12/2024   Health Maintenance Items Addressed: See Nurse Notes at the end of this note  Additional Screening:  Vision Screening: Recommended annual ophthalmology exams for early detection of glaucoma and other disorders of the eye. Would you like a referral to an eye doctor? No    Dental Screening: Recommended annual dental exams for proper oral hygiene  Community Resource Referral / Chronic Care Management: CRR required this visit?  No    CCM required this visit?  No   Plan:    I have personally reviewed and noted the following in the patient's chart:   Medical and social history Use of alcohol, tobacco or illicit drugs  Current medications and supplements including opioid prescriptions. Patient is not currently taking opioid prescriptions. Functional ability and status Nutritional status Physical activity Advanced directives List of other physicians Hospitalizations, surgeries, and ER visits in previous 12 months Vitals Screenings to include cognitive, depression, and falls Referrals and appointments  In addition, I have reviewed and discussed with patient certain preventive protocols, quality metrics, and best practice recommendations. A written personalized care plan for preventive services as well as general preventive health recommendations were provided to patient.   Sonita Michiels L Jazzmen Restivo, CMA   01/13/2024   After Visit Summary: (Mail) Due to this being a telephonic visit, the after visit summary with patients personalized plan was offered to patient via mail   Notes: Patient is due for a colonoscopy and order has been placed today.  She declines all vaccines at this time. Patient is up to date on all health maintenance with no concerns to address today.

## 2024-01-13 NOTE — Patient Instructions (Addendum)
 Brandy Ramos , Thank you for taking time out of your busy schedule to complete your Annual Wellness Visit with me. I enjoyed our conversation and look forward to speaking with you again next year. I, as well as your care team,  appreciate your ongoing commitment to your health goals. Please review the following plan we discussed and let me know if I can assist you in the future. Your Game plan/ To Do List    Referrals: If you haven't heard from the office you've been referred to, please reach out to them at the phone provided.  [name] Located in: [facility] [address] Address: [address], [place], [place] [address] Phone: [phone] Follow up Visits: Next Medicare AWV with our clinical staff: Patient prefers a telephone visit. 01/15/2025.   Have you seen your provider in the last 6 months (3 months if uncontrolled diabetes)? Yes Next Office Visit with your provider: 04/05/2024.  Clinician Recommendations:  Aim for 30 minutes of exercise or brisk walking, 6-8 glasses of water, and 5 servings of fruits and vegetables each day. Keep up the good work.      This is a list of the screening recommended for you and due dates:  Health Maintenance  Topic Date Due   COVID-19 Vaccine (1) Never done   Hepatitis C Screening  Never done   Pneumococcal Vaccine for age over 13 (1 of 2 - PCV) Never done   Zoster (Shingles) Vaccine (1 of 2) Never done   Colon Cancer Screening  06/29/2022   DTaP/Tdap/Td vaccine (2 - Td or Tdap) 04/25/2023   Medicare Annual Wellness Visit  01/12/2024   Flu Shot  01/28/2024   Mammogram  09/08/2024   DEXA scan (bone density measurement)  Completed   Hepatitis B Vaccine  Aged Out   HPV Vaccine  Aged Out   Meningitis B Vaccine  Aged Out    Advanced directives: (Declined) Advance directive discussed with you today. Even though you declined this today, please call our office should you change your mind, and we can  give you the proper paperwork for you to fill out. Advance Care Planning is important because it:  [x]  Makes sure you receive the medical care that is consistent with your values, goals, and preferences  [x]  It provides guidance to your family and loved ones and reduces their decisional burden about whether or not they are making the right decisions based on your wishes.  Follow the link provided in your after visit summary or read over the paperwork we have mailed to you to help you started getting your Advance Directives in place. If you need assistance in completing these, please reach out to us  so that we can help you!  See attachments for Preventive Care and Fall Prevention Tips.

## 2024-03-13 ENCOUNTER — Other Ambulatory Visit: Payer: Self-pay | Admitting: Internal Medicine

## 2024-03-13 MED ORDER — FLUTICASONE PROPIONATE 50 MCG/ACT NA SUSP: 2.0000 | Freq: Every day | NASAL | 6 refills | Status: AC

## 2024-03-13 NOTE — Telephone Encounter (Signed)
 Copied from CRM [id number]. Topic: Clinical - Medication Refill >> Mar 13, 2024  1:08 PM Burnard DEL wrote: Medication: fluticasone  (FLONASE ) 50 MCG/ACT nasal spray  Has the patient contacted their pharmacy? Yes (Agent: If no, request that the patient contact the pharmacy for the refill. If patient does not wish to contact the pharmacy document the reason why and proceed with request.) (Agent: If yes, when and what did the pharmacy advise?)  This is the patient's preferred pharmacy:  [id number] [place], [place] - [address] [place] [address] Phone: [phone] Fax: [phone]    Is this the correct pharmacy for this prescription? Yes If no, delete pharmacy and type the correct one.   Has the prescription been filled recently? No  Is the patient out of the medication? Yes  Has the patient been seen for an appointment in the last year OR does the patient have an upcoming appointment? Yes  Can we respond through MyChart? No  Agent: Please be advised that Rx refills may take up to 3 business days. We ask that you follow-up with your pharmacy.

## 2024-04-05 ENCOUNTER — Encounter: Payer: Self-pay | Admitting: Internal Medicine

## 2024-04-05 ENCOUNTER — Ambulatory Visit: Admitting: Internal Medicine

## 2024-04-05 VITALS — BP 124/68 | HR 65 | Temp 97.9°F | Ht 63.5 in | Wt 107.0 lb

## 2024-04-05 DIAGNOSIS — Z0000 Encounter for general adult medical examination without abnormal findings: Secondary | ICD-10-CM | POA: Diagnosis not present

## 2024-04-05 DIAGNOSIS — E7800 Pure hypercholesterolemia, unspecified: Secondary | ICD-10-CM

## 2024-04-05 DIAGNOSIS — Z85819 Personal history of malignant neoplasm of unspecified site of lip, oral cavity, and pharynx: Secondary | ICD-10-CM

## 2024-04-05 DIAGNOSIS — R7301 Impaired fasting glucose: Secondary | ICD-10-CM

## 2024-04-05 DIAGNOSIS — R636 Underweight: Secondary | ICD-10-CM | POA: Diagnosis not present

## 2024-04-05 DIAGNOSIS — I10 Essential (primary) hypertension: Secondary | ICD-10-CM | POA: Diagnosis not present

## 2024-04-05 LAB — CBC
HCT: 41.1 % (ref 36.0–46.0)
Hemoglobin: 13 g/dL (ref 12.0–15.0)
MCHC: 31.6 g/dL (ref 30.0–36.0)
MCV: 69.6 fl — ABNORMAL LOW (ref 78.0–100.0)
Platelets: 231 K/uL (ref 150.0–400.0)
RBC: 5.9 Mil/uL — ABNORMAL HIGH (ref 3.87–5.11)
RDW: 16.1 % — ABNORMAL HIGH (ref 11.5–15.5)
WBC: 4.6 K/uL (ref 4.0–10.5)

## 2024-04-05 LAB — LIPID PANEL
Cholesterol: 245 mg/dL — ABNORMAL HIGH (ref 0–200)
HDL: 72.6 mg/dL (ref >39.00)
LDL Cholesterol: 160 mg/dL — ABNORMAL HIGH (ref 0–99)
NonHDL: 172.62
Total CHOL/HDL Ratio: 3
Triglycerides: 63 mg/dL (ref 0.0–149.0)
VLDL: 12.6 mg/dL (ref 0.0–40.0)

## 2024-04-05 LAB — COMPREHENSIVE METABOLIC PANEL WITH GFR
ALT: 10 U/L (ref 0–35)
AST: 19 U/L (ref 0–37)
Albumin: 4.3 g/dL (ref 3.5–5.2)
Alkaline Phosphatase: 58 U/L (ref 39–117)
BUN: 11 mg/dL (ref 6–23)
CO2: 30 meq/L (ref 19–32)
Calcium: 9.5 mg/dL (ref 8.4–10.5)
Chloride: 104 meq/L (ref 96–112)
Creatinine, Ser: 0.87 mg/dL (ref 0.40–1.20)
GFR: 65.68 mL/min (ref >60.00)
Glucose, Bld: 91 mg/dL (ref 70–99)
Potassium: 4.3 meq/L (ref 3.5–5.1)
Sodium: 140 meq/L (ref 135–145)
Total Bilirubin: 0.5 mg/dL (ref 0.2–1.2)
Total Protein: 7.1 g/dL (ref 6.0–8.3)

## 2024-04-05 LAB — HEMOGLOBIN A1C: Hgb A1c MFr Bld: 6.1 % (ref 4.6–6.5)

## 2024-04-05 NOTE — Patient Instructions (Signed)
 Let us  know if the weight is not increasing or if it is decreasing.

## 2024-04-05 NOTE — Assessment & Plan Note (Signed)
 Weight is decreasing likely from diet changes. She is encouraged to add high nutrient/high calorie foods and counseled and given information.

## 2024-04-05 NOTE — Assessment & Plan Note (Signed)
BP at goal on losartan and amlodipine. Checking CMP and adjust as needed.  

## 2024-04-05 NOTE — Progress Notes (Signed)
   Subjective:   Patient ID: Brandy Ramos, female    DOB: 1951/05/09, 73 y.o.   MRN: [id number]  The patient is here for physical. Pertinent topics discussed: Discussed the use of AI scribe software for clinical note transcription with the patient, who gave verbal consent to proceed.  History of Present Illness Brandy Ramos is a 73 year old female who presents with unintentional weight loss and dietary changes.  She has experienced an unintentional weight loss of approximately eight pounds since the spring, attributed to dietary changes influenced by her daughter's juicing habits, leading to reduced solid food intake. She acknowledges eating differently and consuming less food overall, while trying to support her daughter's dietary efforts but recognizes the need to maintain her own weight.  She experiences occasional heartburn, described as 'acid' issues, which has improved with dietary adjustments such as eating earlier in the evening to avoid lying down soon after meals. Although the heartburn is not completely resolved, it is better than before.  Dizziness occurs when bending over, which she associates with nasal congestion and sinus issues. She uses Flonase  for this condition but admits to inconsistent use.  She has a history of vision issues, particularly at night, which she attributes to potential glaucoma. She uses prescription eyeglasses and reading glasses, though she feels the reading glasses may be too strong. Her eye doctor is monitoring her for glaucoma, and she experiences cloudy vision, especially in the evening.  No new sweats, chills, chest pain, breathing difficulties, lumps, bumps, stomach troubles, diarrhea, constipation, or new skin spots. She also reports no new muscle or joint issues, headaches, migraines, or hearing problems.  PMH, [facility], social history reviewed and updated  Review of Systems  Constitutional:  Positive for unexpected weight change.  HENT:   Positive for congestion.   Eyes: Negative.   Respiratory:  Negative for cough, chest tightness and shortness of breath.   Cardiovascular:  Negative for chest pain, palpitations and leg swelling.  Gastrointestinal:  Negative for abdominal distention, abdominal pain, constipation, diarrhea, nausea and vomiting.  Musculoskeletal: Negative.   Skin: Negative.   Neurological: Negative.   Psychiatric/Behavioral: Negative.      Objective:  Physical Exam Constitutional:      Appearance: She is well-developed.  HENT:     Head: Normocephalic and atraumatic.  Cardiovascular:     Rate and Rhythm: Normal rate and regular rhythm.  Pulmonary:     Effort: Pulmonary effort is normal. No respiratory distress.     Breath sounds: Normal breath sounds. No wheezing or rales.  Abdominal:     General: Bowel sounds are normal. There is no distension.     Palpations: Abdomen is soft.     Tenderness: There is no abdominal tenderness.  Musculoskeletal:     Cervical back: Normal range of motion.  Skin:    General: Skin is warm and dry.  Neurological:     Mental Status: She is alert and oriented to person, place, and time.     Coordination: Coordination normal.     Vitals:   04/05/24 0959  BP: 124/68  Pulse: 65  Temp: 97.9 F (36.6 C)  TempSrc: Oral  SpO2: 97%  Weight: 107 lb (48.5 kg)  Height: 5' 3.5 (1.613 m)    Assessment & Plan:

## 2024-04-05 NOTE — Assessment & Plan Note (Signed)
 No new mass or lesion.

## 2024-04-05 NOTE — Assessment & Plan Note (Signed)
 Checking Hga1c adjust as needed.

## 2024-04-05 NOTE — Assessment & Plan Note (Signed)
 Flu shot declines. Pneumonia declines. Shingrix declines. Tetanus declines. Colonoscopy referral done. Mammogram up to date, pap smear aged out and dexa complete. Counseled about sun safety and mole surveillance. Counseled about the dangers of distracted driving. Given 10 year screening recommendations.

## 2024-04-05 NOTE — Assessment & Plan Note (Signed)
 Checking lipid panel and not taking prescribed statin.

## 2024-04-10 ENCOUNTER — Ambulatory Visit: Payer: Self-pay | Admitting: Internal Medicine

## 2025-01-15 ENCOUNTER — Ambulatory Visit
